# Patient Record
Sex: Male | Born: 1955
Health system: Southern US, Community
[De-identification: ages and names within clinical notes are randomized; demographics above are authoritative.]

## PROBLEM LIST (undated history)

## (undated) DIAGNOSIS — C73 Malignant neoplasm of thyroid gland: Secondary | ICD-10-CM

## (undated) DIAGNOSIS — Z87442 Personal history of urinary calculi: Secondary | ICD-10-CM

## (undated) HISTORY — DX: Malignant neoplasm of thyroid gland: C73

---

## 2002-06-19 HISTORY — PX: WRIST FRACTURE SURGERY: SHX121

## 2003-03-02 ENCOUNTER — Observation Stay (HOSPITAL_COMMUNITY): Admission: EM | Admit: 2003-03-02 | Discharge: 2003-03-02 | Payer: Self-pay | Admitting: Emergency Medicine

## 2003-03-02 ENCOUNTER — Encounter: Payer: Self-pay | Admitting: Orthopedic Surgery

## 2003-03-02 ENCOUNTER — Encounter: Payer: Self-pay | Admitting: Emergency Medicine

## 2003-04-22 ENCOUNTER — Ambulatory Visit (HOSPITAL_COMMUNITY): Admission: RE | Admit: 2003-04-22 | Discharge: 2003-04-22 | Payer: Self-pay | Admitting: Orthopedic Surgery

## 2005-05-31 ENCOUNTER — Ambulatory Visit: Payer: Self-pay | Admitting: Orthopedic Surgery

## 2012-06-19 ENCOUNTER — Emergency Department (HOSPITAL_COMMUNITY): Payer: BC Managed Care – PPO

## 2012-06-19 ENCOUNTER — Encounter (HOSPITAL_COMMUNITY): Payer: Self-pay | Admitting: *Deleted

## 2012-06-19 ENCOUNTER — Emergency Department (HOSPITAL_COMMUNITY)
Admission: EM | Admit: 2012-06-19 | Discharge: 2012-06-19 | Disposition: A | Discharge status: Home / Self Care | Payer: BC Managed Care – PPO | Attending: Emergency Medicine | Admitting: Emergency Medicine

## 2012-06-19 DIAGNOSIS — Y939 Activity, unspecified: Secondary | ICD-10-CM | POA: Insufficient documentation

## 2012-06-19 DIAGNOSIS — S62639B Displaced fracture of distal phalanx of unspecified finger, initial encounter for open fracture: Secondary | ICD-10-CM

## 2012-06-19 DIAGNOSIS — IMO0002 Reserved for concepts with insufficient information to code with codable children: Secondary | ICD-10-CM

## 2012-06-19 DIAGNOSIS — W230XXA Caught, crushed, jammed, or pinched between moving objects, initial encounter: Secondary | ICD-10-CM | POA: Insufficient documentation

## 2012-06-19 DIAGNOSIS — Z23 Encounter for immunization: Secondary | ICD-10-CM | POA: Insufficient documentation

## 2012-06-19 DIAGNOSIS — Y929 Unspecified place or not applicable: Secondary | ICD-10-CM | POA: Insufficient documentation

## 2012-06-19 MED ORDER — TETANUS-DIPHTH-ACELL PERTUSSIS 5-2.5-18.5 LF-MCG/0.5 IM SUSP
0.5000 mL | Freq: Once | INTRAMUSCULAR | Status: AC
  Administered 2012-06-19: 0.5 mL via INTRAMUSCULAR
  Filled 2012-06-19: qty 0.5

## 2012-06-19 NOTE — ED Notes (Addendum)
To ED for eval after right small finger injury. Pt states he was seen at hospital in [place] and told he broke his finger and needed to call to have it 'reset' tomorrow. Pt to ED due to continued bleeding and 'bone sticking out of finger'. States he was only given a script for abx and none in ED. When bandage removed appears to be chip of bone showing in open lac just under nail bed. Bleeding controlled

## 2012-06-19 NOTE — ED Notes (Signed)
Pt transported to xray 

## 2012-06-19 NOTE — ED Notes (Signed)
Pt to be discharged home. No further questions at the time.

## 2012-06-19 NOTE — ED Provider Notes (Signed)
History     CSN: [id number]  Arrival date & time 06/19/12  7829   First MD Initiated Contact with Patient 06/19/12 1007      Chief Complaint  Patient presents with  . Finger Injury    (Consider location/radiation/quality/duration/timing/severity/associated sxs/prior treatment) The history is provided by the patient and medical records.    Angel Chambers is a 57 y.o. male  with no known medical history presents to the Emergency Department complaining of acute, stabilized laceration and crush injury to the right distal little finger onset yesterday afternoon. Patient was seen in [facility] where the wound was washed and he was given prescriptions to go home with but nothing else was done. Patient was not given a T-DAP booster that time. Patient presents today concerned about the fact the wound is opened and he is not sure how he needs to followup. Associated symptoms include uncontrolled bleeding, pain.  Nothing makes it better and nothing makes it worse.  Pt denies fever, chills, headache, neck pain, no shortness of breath, abdominal pain, nausea, vomiting, diarrhea.     History reviewed. No pertinent past medical history.  History reviewed. No pertinent past surgical history.  History reviewed. No pertinent family history.  History  Substance Use Topics  . Smoking status: Never Smoker   . Smokeless tobacco: Not on file  . Alcohol Use: No      Review of Systems  Constitutional: Negative for fever.  Skin: Positive for wound.  Neurological: Negative for weakness and numbness.  All other systems reviewed and are negative.    Allergies  Review of patient's allergies indicates no known allergies.  Home Medications  No current outpatient prescriptions on file.  BP 127/92  Pulse 69  Temp 98.1 F (36.7 C) (Oral)  Resp 20  SpO2 98%  Physical Exam  Nursing note and vitals reviewed. Constitutional: He appears well-developed and well-nourished. No distress.  HENT:    Head: Normocephalic and atraumatic.  Eyes: Conjunctivae normal are normal. No scleral icterus.  Cardiovascular: Normal rate, regular rhythm, normal heart sounds and intact distal pulses.  Exam reveals no gallop and no friction rub.   No murmur heard.      Capillary refill less than 3 seconds  Pulmonary/Chest: Effort normal and breath sounds normal. No respiratory distress. He has no wheezes.  Musculoskeletal: Normal range of motion. He exhibits tenderness. He exhibits no edema.       ROM: Full range of motion of the right little finger, fingers on the right hand and right wrist  Neurological: He is alert. Coordination normal.       Sensation sensation intact to dull and sharp Strength 5 out of 5 in the right little finger, fingers of the right hand, right wrist  Skin: Skin is warm and dry. He is not diaphoretic.       Laceration to the distal portion of the right little finger with cuticle disruption into the base of the nail is lifted out of the bed    ED Course  Procedures (including critical care time)  Labs Reviewed - No data to display Dg Finger Little Right  06/19/2012  *RADIOLOGY REPORT*  Clinical Data: Bleeding and deformity secondary to trauma.  RIGHT LITTLE FINGER 2+V  Comparison: None.  Findings: There is a comminuted fracture of the distal aspect of the proximal phalangeal bone.  There is an overlying dorsal laceration.  No other abnormalities.  IMPRESSION: Comminuted fracture of the distal phalangeal bone of the right little finger.  Original Report Authenticated By: Francene Boyers, M.D.    LACERATION REPAIR Performed by: Dierdre Forth Authorized by: Dierdre Forth Consent: Verbal consent obtained. Risks and benefits: risks, benefits and alternatives were discussed Consent given by: patient Patient identity confirmed: provided demographic data Prepped and Draped in normal sterile fashion Wound explored  Laceration Location: Right hand, little finger  distal  Laceration Length: 2 cm  No Foreign Bodies seen or palpated  Anesthesia: Nerve block   Local anesthetic: lidocaine 2 % without epinephrine  Anesthetic total: 5 ml  Irrigation method: syringe Amount of cleaning: standard  Skin closure: 5-0 Prolene   Number of sutures: 2   Technique: Interrupted, just enough to approximate some of the tissue, wound does not close tightly   Patient tolerance: Patient tolerated the procedure well with no immediate complications.   1. Laceration   2. Fracture of distal phalanx of finger, open       MDM  Angel Chambers presents after being seen at [facility] with concerns about his right finger.  She would open wound.  X-ray with comminuted fracture of the distal phalangeal bone of the right little finger.  Discussed the patient with Dr. Melvyn Novas he suggested tacking the skin a laceration back together and then having the patient followup with him in the clinic tomorrow.  Discussed these findings with the patient as well as wound care.  Tdap booster given.Pressure irrigation performed. Pt has no co morbidities to effect normal wound healing. Patient given prescription for Keflex yesterday and Avastin to fill that prescription and begin taking it. Discussed suture home care w pt and answered questions. Pt to f-u for wound check with Dr. Melvyn Novas, hand surgery tomorrow. Pt is hemodynamically stable w no complaints prior to dc.     1. Medications: Vicodin and Keflex as prescribed yesterday, usual home medications 2. Treatment: rest, drink plenty of fluids, keep the wound clean and dry 3. Follow Up: Please followup with your primary doctor for discussion of your diagnoses and further evaluation after today's visit; if you do not have a primary care doctor use the resource guide provided to find one; followup with Dr. Melvyn Novas tomorrow morning for evaluation of your finger          Dierdre Forth, PA-C 06/19/12 1318

## 2012-06-19 NOTE — ED Notes (Signed)
Pt presents to department for evaluation of R 5th finger injury. States he was working with [employer] data [employer] when he lacerated finger. States he was seen at [facility] yesterday for same, was told to follow up with orthopedic physician to have finger reset. Upon arrival open 2cm jagged lacertion noted. Pt denies pain. CMS intact. States that when he moves finger he feels a "popping" sensation. States he was prescribed pain medication and antibiotics, but has not had them filled yet.

## 2012-06-19 NOTE — ED Provider Notes (Signed)
Medical screening examination/treatment/procedure(s) were performed by non-physician practitioner and as supervising physician I was immediately available for consultation/collaboration.   Flint Melter, MD 06/19/12 [phone]

## 2013-11-06 ENCOUNTER — Ambulatory Visit: Payer: Self-pay | Admitting: Physician Assistant

## 2013-11-11 ENCOUNTER — Encounter (INDEPENDENT_AMBULATORY_CARE_PROVIDER_SITE_OTHER): Payer: Self-pay

## 2013-11-11 ENCOUNTER — Ambulatory Visit: Payer: BC Managed Care – PPO | Admitting: Family Medicine

## 2013-11-11 VITALS — BP 132/77 | HR 98 | Temp 97.8°F | Ht 69.0 in | Wt 188.8 lb

## 2013-11-11 DIAGNOSIS — Z0289 Encounter for other administrative examinations: Secondary | ICD-10-CM

## 2013-11-11 DIAGNOSIS — Z139 Encounter for screening, unspecified: Secondary | ICD-10-CM

## 2013-11-11 LAB — POCT URINALYSIS DIPSTICK
Bilirubin, UA: NEGATIVE
Blood, UA: NEGATIVE
Glucose, UA: NEGATIVE
Ketones, UA: NEGATIVE
Leukocytes, UA: NEGATIVE
Nitrite, UA: NEGATIVE
Protein, UA: NEGATIVE
Spec Grav, UA: 1.025
Urobilinogen, UA: NEGATIVE
pH, UA: 6.5

## 2013-11-11 NOTE — Progress Notes (Signed)
   Subjective:    Patient ID: Angel Chambers, male    DOB: [birth year], 58 y.o.   MRN: [id number]  HPI  This 58 y.o. 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  He has no acute complaints. He is not on any medicine.  Review of Systems    No chest pain, SOB, HA, dizziness, vision change, N/V, diarrhea, constipation, dysuria, urinary urgency or frequency, myalgias, arthralgias or rash.  Objective:   Physical Exam  Vital signs noted  Well developed well nourished male.  HEENT - Head atraumatic Normocephalic                Eyes - PERRLA, Conjuctiva - clear Sclera- Clear EOMI                Ears - EAC's Wnl TM's Wnl Gross Hearing WNL                Nose - Nares patent                 Throat - oropharanx wnl Respiratory - Lungs CTA bilateral Cardiac - RRR S1 and S2 without murmur GI - Abdomen soft Nontender and bowel sounds active x 4 Extremities - No edema. Neuro - Grossly intact.  Results for orders placed in visit on 11/11/13  POCT URINALYSIS DIPSTICK      Result Value Ref Range   Color, UA yellow     Clarity, UA clear     Glucose, UA neg     Bilirubin, UA neg     Ketones, UA neg     Spec Grav, UA 1.025     Blood, UA neg     pH, UA 6.5     Protein, UA neg     Urobilinogen, UA negative     Nitrite, UA neg     Leukocytes, UA Negative        Assessment & Plan:  Screening - Plan: POCT urinalysis dipstick  Encounter for examination required by Department of Transportation (DOT)  Good for 2 years  Lysbeth Penner FNP

## 2015-10-13 ENCOUNTER — Encounter: Payer: Self-pay | Admitting: Family Medicine

## 2015-10-13 ENCOUNTER — Encounter (INDEPENDENT_AMBULATORY_CARE_PROVIDER_SITE_OTHER): Payer: Self-pay

## 2015-10-13 ENCOUNTER — Ambulatory Visit (INDEPENDENT_AMBULATORY_CARE_PROVIDER_SITE_OTHER): Payer: Self-pay | Admitting: Family Medicine

## 2015-10-13 VITALS — BP 135/88 | HR 72 | Temp 97.0°F | Ht 69.0 in | Wt 194.8 lb

## 2015-10-13 DIAGNOSIS — Z024 Encounter for examination for driving license: Secondary | ICD-10-CM

## 2015-10-13 DIAGNOSIS — Z0289 Encounter for other administrative examinations: Secondary | ICD-10-CM

## 2015-10-13 DIAGNOSIS — R221 Localized swelling, mass and lump, neck: Secondary | ICD-10-CM

## 2015-10-13 LAB — URINALYSIS
BILIRUBIN UA: NEGATIVE
GLUCOSE, UA: NEGATIVE
KETONES UA: NEGATIVE
Leukocytes, UA: NEGATIVE
Nitrite, UA: NEGATIVE
PROTEIN UA: NEGATIVE
RBC, UA: NEGATIVE
Specific Gravity, UA: 1.025 (ref 1.005–1.030)
UUROB: 0.2 mg/dL (ref 0.2–1.0)
pH, UA: 6 (ref 5.0–7.5)

## 2015-10-13 NOTE — Addendum Note (Signed)
Addended by: Denyce Robert on: 10/13/2015 10:05 AM   Modules accepted: Orders

## 2015-10-13 NOTE — Progress Notes (Signed)
Subjective:  Patient ID: Angel Chambers, male    DOB: [birth year]  Age: 60 y.o. MRN: [id number]  CC: DOT PE   HPI Angel Chambers presents for DOT exam   History Angel Chambers has no past medical history on file.   He has no past surgical history on file.   His family history is not on file.He reports that he has never smoked. He does not have any smokeless tobacco history on file. He reports that he does not drink alcohol or use illicit drugs.    ROS Review of Systems  Constitutional: Negative for fever, chills, diaphoresis and unexpected weight change.  HENT: Negative for congestion, hearing loss, rhinorrhea and sore throat.   Eyes: Negative for visual disturbance.  Respiratory: Negative for cough and shortness of breath.   Cardiovascular: Negative for chest pain.  Gastrointestinal: Negative for abdominal pain, diarrhea and constipation.  Genitourinary: Negative for dysuria and flank pain.  Musculoskeletal: Negative for joint swelling and arthralgias.  Skin: Negative for rash.  Neurological: Negative for dizziness and headaches.  Psychiatric/Behavioral: Negative for sleep disturbance and dysphoric mood.    Objective:  BP 135/88 mmHg  Pulse 72  Temp(Src) 97 F (36.1 C) (Oral)  Ht 5\' 9"  (1.753 m)  Wt 194 lb 12.8 oz (88.361 kg)  BMI 28.75 kg/m2  SpO2 97%  BP Readings from Last 3 Encounters:  10/13/15 135/88  11/11/13 132/77  06/19/12 127/92    Wt Readings from Last 3 Encounters:  10/13/15 194 lb 12.8 oz (88.361 kg)  11/11/13 188 lb 12.8 oz (85.639 kg)     Physical Exam  Constitutional: He is oriented to person, place, and time. He appears well-developed and well-nourished. No distress.  HENT:  Head: Normocephalic and atraumatic.  Right Ear: External ear normal.  Left Ear: External ear normal.  Nose: Nose normal.  Mouth/Throat: Oropharynx is clear and moist.  Eyes: Conjunctivae and EOM are normal. Pupils are equal, round, and reactive to light.  Neck: Normal range of  motion. Neck supple. Thyromegaly (1 cm nodule to left of thyroid. Some movement with swallowing. Not clearly attached per palpation) present.  Cardiovascular: Normal rate, regular rhythm and normal heart sounds.   No murmur heard. Pulmonary/Chest: Effort normal and breath sounds normal. No respiratory distress. He has no wheezes. He has no rales.  Abdominal: Soft. Bowel sounds are normal. He exhibits no distension. There is no tenderness.  Lymphadenopathy:    He has no cervical adenopathy.  Neurological: He is alert and oriented to person, place, and time. He has normal reflexes.  Skin: Skin is warm and dry.  Psychiatric: He has a normal mood and affect. His behavior is normal. Judgment and thought content normal.     No results found for: WBC, HGB, HCT, PLT, GLUCOSE, CHOL, TRIG, HDL, LDLDIRECT, LDLCALC, ALT, AST, NA, K, CL, CREATININE, BUN, CO2, TSH, PSA, INR, GLUF, HGBA1C, MICROALBUR  Dg Finger Little Right  06/19/2012  *RADIOLOGY REPORT* Clinical Data: Bleeding and deformity secondary to trauma. RIGHT LITTLE FINGER 2+V Comparison: None. Findings: There is a comminuted fracture of the distal aspect of the proximal phalangeal bone.  There is an overlying dorsal laceration.  No other abnormalities. IMPRESSION: Comminuted fracture of the distal phalangeal bone of the right little finger. Original Report Authenticated By: Lorriane Shire, M.D.    Assessment & Plan:   Angel Chambers was seen today for dot pe.  Diagnoses and all orders for this visit:  Encounter for examination required by Department of Transportation (DOT)  Nodule of neck      Angel Chambers does not currently have medications on file.  No orders of the defined types were placed in this encounter.     Follow-up: Return in about 2 years (around 10/12/2017), or and for monitoring of nodule.  Claretta Fraise, M.D.

## 2016-01-11 DIAGNOSIS — S61213A Laceration without foreign body of left middle finger without damage to nail, initial encounter: Secondary | ICD-10-CM | POA: Diagnosis not present

## 2016-01-11 DIAGNOSIS — Z23 Encounter for immunization: Secondary | ICD-10-CM | POA: Diagnosis not present

## 2016-01-11 DIAGNOSIS — W293XXA Contact with powered garden and outdoor hand tools and machinery, initial encounter: Secondary | ICD-10-CM | POA: Diagnosis not present

## 2016-01-11 DIAGNOSIS — S6992XA Unspecified injury of left wrist, hand and finger(s), initial encounter: Secondary | ICD-10-CM | POA: Diagnosis not present

## 2016-01-11 DIAGNOSIS — S61211A Laceration without foreign body of left index finger without damage to nail, initial encounter: Secondary | ICD-10-CM | POA: Diagnosis not present

## 2017-10-19 ENCOUNTER — Ambulatory Visit: Payer: Self-pay | Admitting: Family Medicine

## 2017-10-19 ENCOUNTER — Encounter: Payer: Self-pay | Admitting: Family Medicine

## 2017-10-19 VITALS — BP 130/81 | HR 72 | Temp 97.1°F | Ht 69.0 in | Wt 191.5 lb

## 2017-10-19 DIAGNOSIS — Z024 Encounter for examination for driving license: Secondary | ICD-10-CM

## 2017-10-19 DIAGNOSIS — E041 Nontoxic single thyroid nodule: Secondary | ICD-10-CM

## 2017-10-19 NOTE — Progress Notes (Signed)
   Subjective:  Patient ID: Angel Chambers, male    DOB: 26-Mar-1956  Age: 62 y.o. MRN: [id number]  CC: Private DOT Physical   HPI Angel Chambers presents for DOT exam  Depression screen [facility] 2/9 10/13/2015 11/11/2013  Decreased Interest 0 0  Down, Depressed, Hopeless 0 0  PHQ - 2 Score 0 0    History Angel Chambers has no past medical history on file.   Angel Chambers has no past surgical history on file.   His family history is not on file.Angel Chambers reports that Angel Chambers has never smoked. Angel Chambers has never used smokeless tobacco. Angel Chambers reports that Angel Chambers does not drink alcohol or use drugs.    ROS Review of Systems  Constitutional: Negative.   HENT: Negative.   Eyes: Negative for visual disturbance.  Respiratory: Negative for cough and shortness of breath.   Cardiovascular: Negative for chest pain and leg swelling.  Gastrointestinal: Negative for abdominal pain, diarrhea, nausea and vomiting.  Genitourinary: Negative for difficulty urinating.  Musculoskeletal: Negative for arthralgias and myalgias.  Skin: Negative for rash.  Neurological: Negative for headaches.  Psychiatric/Behavioral: Negative for sleep disturbance.    Objective:  BP 130/81   Pulse 72   Temp (!) 97.1 F (36.2 C) (Oral)   Ht 5\' 9"  (1.753 m)   Wt 191 lb 8 oz (86.9 kg)   BMI 28.28 kg/m   BP Readings from Last 3 Encounters:  10/19/17 130/81  10/13/15 135/88  11/11/13 132/77    Wt Readings from Last 3 Encounters:  10/19/17 191 lb 8 oz (86.9 kg)  10/13/15 194 lb 12.8 oz (88.4 kg)  11/11/13 188 lb 12.8 oz (85.6 kg)     Physical Exam  Constitutional: Angel Chambers is oriented to person, place, and time. Angel Chambers appears well-developed and well-nourished. No distress.  HENT:  Head: Normocephalic and atraumatic.  Right Ear: External ear normal.  Left Ear: External ear normal.  Nose: Nose normal.  Mouth/Throat: Oropharynx is clear and moist.  Eyes: Pupils are equal, round, and reactive to light. Conjunctivae and EOM are normal.  Neck: Normal range of  motion. Neck supple. Thyromegaly (There is approximately 1 cm round smooth nodule noted left side of the thyroid on palpation) present.  Cardiovascular: Normal rate, regular rhythm and normal heart sounds.  No murmur heard. Pulmonary/Chest: Effort normal and breath sounds normal. No respiratory distress. Angel Chambers has no wheezes. Angel Chambers has no rales.  Abdominal: Soft. Bowel sounds are normal. Angel Chambers exhibits no distension. There is no tenderness.  Lymphadenopathy:    Angel Chambers has no cervical adenopathy.  Neurological: Angel Chambers is alert and oriented to person, place, and time. Angel Chambers has normal reflexes.  Skin: Skin is warm and dry.  Psychiatric: Angel Chambers has a normal mood and affect. His behavior is normal. Judgment and thought content normal.      Assessment & Plan:   Angel Chambers was seen today for private dot physical.  Diagnoses and all orders for this visit:  Encounter for Department of Transportation (DOT) examination for driving license renewal  Nodule of left lobe of thyroid gland -     US THYROID; Future       Angel Chambers does not currently have medications on file.  Allergies as of 10/19/2017   No Known Allergies     Medication List    as of 10/19/2017 11:05 AM   You have not been prescribed any medications.      Follow-up: Return in about 2 weeks (around 11/02/2017).  Claretta Fraise, M.D.

## 2017-10-29 ENCOUNTER — Ambulatory Visit (HOSPITAL_COMMUNITY)
Admission: RE | Admit: 2017-10-29 | Discharge: 2017-10-29 | Disposition: A | Discharge status: Home / Self Care | Payer: BLUE CROSS/BLUE SHIELD | Source: Ambulatory Visit | Attending: Family Medicine | Admitting: Family Medicine

## 2017-10-29 DIAGNOSIS — E041 Nontoxic single thyroid nodule: Secondary | ICD-10-CM | POA: Diagnosis not present

## 2017-10-30 ENCOUNTER — Encounter: Payer: Self-pay | Admitting: Family Medicine

## 2017-10-30 ENCOUNTER — Ambulatory Visit: Payer: BLUE CROSS/BLUE SHIELD | Admitting: Family Medicine

## 2017-10-30 VITALS — BP 127/83 | HR 65 | Temp 97.8°F | Ht 69.0 in | Wt 193.1 lb

## 2017-10-30 DIAGNOSIS — Z114 Encounter for screening for human immunodeficiency virus [HIV]: Secondary | ICD-10-CM

## 2017-10-30 DIAGNOSIS — E041 Nontoxic single thyroid nodule: Secondary | ICD-10-CM

## 2017-10-30 DIAGNOSIS — Z1159 Encounter for screening for other viral diseases: Secondary | ICD-10-CM | POA: Diagnosis not present

## 2017-10-30 NOTE — Progress Notes (Signed)
 Subjective:  Patient ID: Angel Chambers, male    DOB: 01/10/1956  Age: 61 y.o. MRN: [id number]  CC: Results (pt here today to go over thyroid ultrasound)   HPI Angel Chambers presents for patient was recently in for a DOT physical and noted to have a moderate nodule on his thyroid during that evaluation.  He was sent for an ultrasound and is here today for consultation regarding the results.  Of note is that he has gained 10 pounds in the last year.  His energy is decreased by one half compared to what it was 10 years ago.  Otherwise he denies any thyroid symptoms including feeling hot or cold constipation hair loss or myxedema.  Depression screen PHQ 2/9 10/30/2017 10/13/2015 11/11/2013  Decreased Interest 0 0 0  Down, Depressed, Hopeless 0 0 0  PHQ - 2 Score 0 0 0    History Angel Chambers has no past medical history on file.   He has no past surgical history on file.   His family history is not on file.He reports that he has never smoked. He has never used smokeless tobacco. He reports that he does not drink alcohol or use drugs.    ROS Review of Systems Noncontributory except as per HPI Objective:  BP 127/83   Pulse 65   Temp 97.8 F (36.6 C) (Oral)   Ht 5' 9" (1.753 m)   Wt 193 lb 2 oz (87.6 kg)   BMI 28.52 kg/m   BP Readings from Last 3 Encounters:  10/30/17 127/83  10/19/17 130/81  10/13/15 135/88    Wt Readings from Last 3 Encounters:  10/30/17 193 lb 2 oz (87.6 kg)  10/19/17 191 lb 8 oz (86.9 kg)  10/13/15 194 lb 12.8 oz (88.4 kg)     Physical Exam  Constitutional: He is oriented to person, place, and time. He appears well-developed and well-nourished. No distress.  Patient stutters  HENT:  Head: Normocephalic and atraumatic.  Eyes: Pupils are equal, round, and reactive to light. Conjunctivae and EOM are normal.  Neck: Normal range of motion. Thyromegaly present.  Musculoskeletal: Normal range of motion.  Neurological: He is alert and oriented to person, place, and  time. No cranial nerve deficit.  Skin: Skin is warm and dry.  Psychiatric: He has a normal mood and affect.   2 thyroid nodules were noted on the patient's ultrasound.  Each of these was reviewed with the patient today.   Assessment & Plan:   Angel Chambers was seen today for results.  Diagnoses and all orders for this visit:  Nodule of left lobe of thyroid gland -     CBC with Differential/Platelet -     CMP14+EGFR -     TSH -     US Thyroid Biopsy; Future  Screening for HIV (human immunodeficiency virus) -     HIV antibody  Encounter for hepatitis C screening test for low risk patient -     Hepatitis C antibody   Discussion of the implications of nodules was performed.  The larger of the 2 nodules carries a recommendation for fine-needle aspiration.  The radiologist had previously explained this to the patient he is comfortable with the idea and will go forward with that.  The smaller one carries the recommendation of repeat annually.  Patient is in agreement with that as well.  This will be performed as well in 1 year.    Angel Chambers does not currently have medications on file.    Allergies as of 10/30/2017   No Known Allergies     Medication List    as of 10/30/2017  6:26 PM   You have not been prescribed any medications.      Follow-up: No follow-ups on file.  Warren Stacks, M.D. 

## 2017-10-31 ENCOUNTER — Other Ambulatory Visit: Payer: Self-pay | Admitting: Family Medicine

## 2017-10-31 LAB — CBC WITH DIFFERENTIAL/PLATELET
BASOS: 1 %
Basophils Absolute: 0 10*3/uL (ref 0.0–0.2)
EOS (ABSOLUTE): 0.1 10*3/uL (ref 0.0–0.4)
Eos: 2 %
HEMOGLOBIN: 15.1 g/dL (ref 13.0–17.7)
Hematocrit: 43.8 % (ref 37.5–51.0)
IMMATURE GRANS (ABS): 0 10*3/uL (ref 0.0–0.1)
Immature Granulocytes: 0 %
LYMPHS ABS: 1.3 10*3/uL (ref 0.7–3.1)
LYMPHS: 27 %
MCH: 31.7 pg (ref 26.6–33.0)
MCHC: 34.5 g/dL (ref 31.5–35.7)
MCV: 92 fL (ref 79–97)
Monocytes Absolute: 0.4 10*3/uL (ref 0.1–0.9)
Monocytes: 8 %
NEUTROS ABS: 3.1 10*3/uL (ref 1.4–7.0)
Neutrophils: 62 %
Platelets: 175 10*3/uL (ref 150–379)
RBC: 4.76 x10E6/uL (ref 4.14–5.80)
RDW: 13.6 % (ref 12.3–15.4)
WBC: 5 10*3/uL (ref 3.4–10.8)

## 2017-10-31 LAB — CMP14+EGFR
ALBUMIN: 4.3 g/dL (ref 3.6–4.8)
ALT: 15 IU/L (ref 0–44)
AST: 12 IU/L (ref 0–40)
Albumin/Globulin Ratio: 1.8 (ref 1.2–2.2)
Alkaline Phosphatase: 81 IU/L (ref 39–117)
BILIRUBIN TOTAL: 0.4 mg/dL (ref 0.0–1.2)
BUN / CREAT RATIO: 14 (ref 10–24)
BUN: 13 mg/dL (ref 8–27)
CHLORIDE: 104 mmol/L (ref 96–106)
CO2: 22 mmol/L (ref 20–29)
CREATININE: 0.91 mg/dL (ref 0.76–1.27)
Calcium: 9.6 mg/dL (ref 8.6–10.2)
GFR calc non Af Amer: 91 mL/min/{1.73_m2} (ref >59)
GFR, EST AFRICAN AMERICAN: 106 mL/min/{1.73_m2} (ref >59)
GLUCOSE: 106 mg/dL — AB (ref 65–99)
Globulin, Total: 2.4 g/dL (ref 1.5–4.5)
POTASSIUM: 4.4 mmol/L (ref 3.5–5.2)
SODIUM: 142 mmol/L (ref 134–144)
TOTAL PROTEIN: 6.7 g/dL (ref 6.0–8.5)

## 2017-10-31 LAB — HIV ANTIBODY (ROUTINE TESTING W REFLEX): HIV Screen 4th Generation wRfx: NONREACTIVE

## 2017-10-31 LAB — HEPATITIS C ANTIBODY

## 2017-10-31 LAB — TSH: TSH: 1.46 u[IU]/mL (ref 0.450–4.500)

## 2017-11-05 ENCOUNTER — Telehealth: Payer: Self-pay | Admitting: *Deleted

## 2017-11-05 NOTE — Telephone Encounter (Signed)
Patient has an appt on 11/08/17 for [place] at [facility]

## 2017-11-08 ENCOUNTER — Ambulatory Visit (HOSPITAL_COMMUNITY)
Admission: RE | Admit: 2017-11-08 | Discharge: 2017-11-08 | Disposition: A | Discharge status: Home / Self Care | Payer: BLUE CROSS/BLUE SHIELD | Source: Ambulatory Visit | Attending: Family Medicine | Admitting: Family Medicine

## 2017-11-08 ENCOUNTER — Encounter (HOSPITAL_COMMUNITY): Payer: Self-pay

## 2017-11-08 DIAGNOSIS — E041 Nontoxic single thyroid nodule: Secondary | ICD-10-CM | POA: Diagnosis not present

## 2017-11-08 DIAGNOSIS — D497 Neoplasm of unspecified behavior of endocrine glands and other parts of nervous system: Secondary | ICD-10-CM | POA: Diagnosis not present

## 2017-11-08 DIAGNOSIS — C73 Malignant neoplasm of thyroid gland: Secondary | ICD-10-CM | POA: Diagnosis not present

## 2017-11-08 MED ORDER — LIDOCAINE HCL (PF) 2 % IJ SOLN
INTRAMUSCULAR | Status: AC
  Administered 2017-11-08: 10 mL
  Filled 2017-11-08: qty 10

## 2017-11-08 NOTE — Sedation Documentation (Signed)
Thyroid biopsy completed, discharge instruction given, verbalized understanding. Denies pain. Sample labeled and taken to the lab

## 2017-11-08 NOTE — Discharge Instructions (Signed)
Thyroid Biopsy °The thyroid gland is a butterfly-shaped gland located in the front of the neck. It produces hormones that affect metabolism, growth and development, and body temperature. Thyroid biopsy is a procedure in which small samples of tissue or fluid are removed from the thyroid gland. The samples are then looked at under a microscope to check for abnormalities. This procedure is done to determine the cause of thyroid problems. It may be done to check for infection, cancer, or other thyroid problems. °Two methods may be used for a thyroid biopsy. In one method, a thin needle is inserted through the skin and into the thyroid gland. In the other method, an open incision is made through the skin. °Tell a health care provider about: °· Any allergies you have. °· All medicines you are taking, including vitamins, herbs, eye drops, creams, and over-the-counter medicines. °· Any problems you or family members have had with anesthetic medicines. °· Any blood disorders you have. °· Any surgeries you have had. °· Any medical conditions you have. °What are the risks? °Generally, this is a safe procedure. However, problems can occur and include: °· Bleeding from the procedure site. °· Infection. °· Injury to structures near the thyroid gland. ° °What happens before the procedure? °· Ask your health care provider about: °? Changing or stopping your regular medicines. This is especially important if you are taking diabetes medicines or blood thinners. °? Taking medicines such as aspirin and ibuprofen. These medicines can thin your blood. Do not take these medicines before your procedure if your health care provider asks you not to. °· Do not eat or drink anything after midnight on the night before the procedure or as directed by your health care provider. °· You may have a blood sample taken. °What happens during the procedure? °Either of these methods may be used to perform a thyroid biopsy: °· Fine needle biopsy. You may  be given medicine to help you relax (sedative). You will be asked to lie on your back with your head tipped backward to extend your neck. An area on your neck will be cleaned. A needle will then be inserted through the skin of your neck. You may be asked to avoid coughing, talking, swallowing, or making sounds during some portions of the procedure. The needle will be withdrawn once the tissue or fluid samples have been removed. Pressure may be applied to your neck to reduce swelling and ensure that bleeding has stopped. The samples will be sent to a lab for examination. °· Open biopsy. You will be given medicine to make you sleep (general anesthetic). An incision will be made in your neck. A sample of thyroid tissue will be removed using surgical tools. The tissue sample will be sent for examination. In some cases, the sample may be examined during the biopsy. If that is done and cancer cells are found, some or all of the thyroid gland may be removed. The incision will be closed with stitches. ° °What happens after the procedure? °· Your recovery will be assessed and monitored. °· You may have soreness and tenderness at the site of the biopsy. This should go away after a few days. °· If you had an open biopsy, you may have a hoarse voice or sore throat for a couple days. °· It is your responsibility to get your test results. °This information is not intended to replace advice given to you by your health care provider. Make sure you discuss any questions you have with   your health care provider. °Document Released: 04/02/2007 Document Revised: 02/06/2016 Document Reviewed: 08/28/2013 °Elsevier Interactive Patient Education © 2018 [employer] ° °

## 2017-11-08 NOTE — Procedures (Signed)
PreOperative Dx: LT thyroid nodule Postoperative Dx: LT thyroid nodule Procedure:   US guided FNA of LT thyroid nodule Radiologist:  Thornton Papas Anesthesia:  2 ml of 2% lidocaine Specimen:  FNA x 5 plus aspiration of cystic fluid  EBL:   < 1 ml Complications: None

## 2017-11-10 DIAGNOSIS — E041 Nontoxic single thyroid nodule: Secondary | ICD-10-CM | POA: Diagnosis not present

## 2017-11-14 ENCOUNTER — Other Ambulatory Visit: Payer: Self-pay | Admitting: Family Medicine

## 2017-11-14 MED ORDER — FOLIC ACID-VIT B6-VIT B12 2.5-25-1 MG PO TABS: 1.0000 | ORAL_TABLET | Freq: Every day | ORAL | 3 refills | Status: DC

## 2017-11-14 MED ORDER — VITAMIN D (ERGOCALCIFEROL) 1.25 MG (50000 UNIT) PO CAPS: 50000.0000 [IU] | ORAL_CAPSULE | ORAL | 3 refills | Status: DC

## 2017-11-28 ENCOUNTER — Encounter (HOSPITAL_COMMUNITY): Payer: Self-pay

## 2017-12-06 ENCOUNTER — Telehealth: Payer: Self-pay | Admitting: Family Medicine

## 2017-12-07 ENCOUNTER — Ambulatory Visit (INDEPENDENT_AMBULATORY_CARE_PROVIDER_SITE_OTHER): Payer: BLUE CROSS/BLUE SHIELD | Admitting: Pediatrics

## 2017-12-07 DIAGNOSIS — C73 Malignant neoplasm of thyroid gland: Secondary | ICD-10-CM

## 2017-12-07 NOTE — Telephone Encounter (Signed)
Return patient call.  He has not heard anything about the biopsy results.  Reviewing the biopsy results, consistent with papillary carcinoma.  He has an appointment to see me this afternoon so we can review results and refer to appropriate next steps.

## 2017-12-07 NOTE — Progress Notes (Signed)
Discussed results of biopsy.  Set patient up with appoint with oncology.

## 2017-12-11 ENCOUNTER — Encounter (HOSPITAL_COMMUNITY): Payer: Self-pay | Admitting: Hematology

## 2017-12-11 ENCOUNTER — Inpatient Hospital Stay (HOSPITAL_COMMUNITY): Payer: BLUE CROSS/BLUE SHIELD | Attending: Hematology | Admitting: Hematology

## 2017-12-11 ENCOUNTER — Other Ambulatory Visit: Payer: Self-pay

## 2017-12-11 DIAGNOSIS — Z8049 Family history of malignant neoplasm of other genital organs: Secondary | ICD-10-CM | POA: Diagnosis not present

## 2017-12-11 DIAGNOSIS — C73 Malignant neoplasm of thyroid gland: Secondary | ICD-10-CM | POA: Diagnosis not present

## 2017-12-11 NOTE — Assessment & Plan Note (Signed)
1.  Papillary thyroid carcinoma: - Was found to have thyroid nodule on physical examination, ultrasound of the thyroid on 10/29/2017 showed left superior nodule 1.5 cm, solid and a second nodule in the left mid thyroid, measuring 4.1 cm, mixed cystic and solid with no evidence of adenopathy. - Fine-needle aspiration of thyroid nodule in the mid left lobe on 11/08/2017 consistent with papillary carcinoma, BRAF V600 E mutation positive. - He does not have any symptoms including dysphagia, hoarseness.  The mass is not bulky and does not clinically appear to extend below the substernum.  Hence I would not recommend any further scans at this time.  I will make a referral to surgery for total thyroidectomy/lobectomy.  I will see him back in a couple of months to discuss the results.

## 2017-12-11 NOTE — Progress Notes (Signed)
[facility] NOTE  Patient Care Team: Claretta Fraise, MD as PCP - General (Family Medicine)  CHIEF COMPLAINTS/PURPOSE OF CONSULTATION:  Papillary thyroid carcinoma.  HISTORY OF PRESENTING ILLNESS:  Angel Chambers 62 y.o. male is seen in consultation today for further management of papillary thyroid carcinoma.  He went for a physical earlier this year and was noted to have a thyroid nodule.  An ultrasound of the neck was ordered on 10/29/2017 which showed left superior thyroid nodule measuring 1.5 cm, solid and a second nodule in the left mid thyroid measuring 4.1 cm, mixed cystic and solid with no evidence of adenopathy.  On 11/08/2017 he underwent fine-needle aspiration of the left mid thyroid nodule.  This was consistent with papillary carcinoma of thyroid.  He does not report any fevers, night sweats or weight loss.  He denies any dysphagia or odynophagia or recent onset hoarseness.  No other local symptoms or generalized symptoms noted.  No new onset bone pains.  He works as a Copy.  No family history of thyroid malignancies although his brother had hyperthyroidism for which he underwent radioiodine ablation.  He was a never smoker.  He is accompanied by his wife today.  MEDICAL HISTORY:  Past Medical History:  Diagnosis Date  . Thyroid cancer [facility])     SURGICAL HISTORY: Past Surgical History:  Procedure Laterality Date  . WRIST FRACTURE SURGERY  2004    SOCIAL HISTORY: Social History   Socioeconomic History  . Marital status: Married    Spouse name: Not on file  . Number of children: Not on file  . Years of education: Not on file  . Highest education level: Not on file  Occupational History  . Not on file  Social Needs  . Financial resource strain: Not on file  . Food insecurity:    Worry: Not on file    Inability: Not on file  . Transportation needs:    Medical: Not on file    Non-medical: Not on file  Tobacco Use  . Smoking  status: Never Smoker  . Smokeless tobacco: Never Used  Substance and Sexual Activity  . Alcohol use: No  . Drug use: No  . Sexual activity: Yes  Lifestyle  . Physical activity:    Days per week: Not on file    Minutes per session: Not on file  . Stress: Not on file  Relationships  . Social connections:    Talks on phone: Not on file    Gets together: Not on file    Attends religious service: Not on file    Active member of club or organization: Not on file    Attends meetings of clubs or organizations: Not on file    Relationship status: Not on file  . Intimate partner violence:    Fear of current or ex partner: Not on file    Emotionally abused: Not on file    Physically abused: Not on file    Forced sexual activity: Not on file  Other Topics Concern  . Not on file  Social History Narrative  . Not on file    FAMILY HISTORY: Family History  Problem Relation Age of Onset  . Diabetes Mother   . Kidney disease Mother   . Heart attack Father   . Cancer Sister        hysterectomy due to cancer  . Hypertension Brother   . Heart disease Brother   . Thyroid disease Brother  ALLERGIES:  has No Known Allergies.  MEDICATIONS:  No current outpatient medications on file.   No current facility-administered medications for this visit.     REVIEW OF SYSTEMS:   Constitutional: Denies fevers, chills or abnormal night sweats Eyes: Denies blurriness of vision, double vision or watery eyes Ears, nose, mouth, throat, and face: Denies mucositis or sore throat Respiratory: Denies cough, dyspnea or wheezes Cardiovascular: Denies palpitation, chest discomfort or lower extremity swelling Gastrointestinal:  Denies nausea, heartburn or change in bowel habits Skin: Denies abnormal skin rashes Lymphatics: Denies new lymphadenopathy or easy bruising Neurological:Denies numbness, tingling or new weaknesses Behavioral/Psych: Mood is stable, no new changes  All other systems were  reviewed with the patient and are negative.  PHYSICAL EXAMINATION: ECOG PERFORMANCE STATUS: 0 - Asymptomatic  Vitals:   12/11/17 1340  BP: 127/73  Pulse: 72  Resp: 16  SpO2: 99%   Filed Weights   12/11/17 1340  Weight: 193 lb 6.4 oz (87.7 kg)    GENERAL:alert, no distress and comfortable SKIN: skin color, texture, turgor are normal, no rashes or significant lesions EYES: normal, conjunctiva are pink and non-injected, sclera clear OROPHARYNX:no exudate, no erythema and lips, buccal mucosa, and tongue normal  NECK: supple, non-tender, without nodularity.  Left thyroid nodule felt with no adenopathy. LYMPH:  no palpable lymphadenopathy in the cervical, axillary or inguinal LUNGS: clear to auscultation and percussion with normal breathing effort HEART: regular rate & rhythm and no murmurs and no lower extremity edema ABDOMEN:abdomen soft, non-tender and normal bowel sounds PSYCH: alert & oriented x 3 with fluent speech   LABORATORY DATA:  I have reviewed the data as listed Lab Results  Component Value Date   WBC 5.0 10/30/2017   HGB 15.1 10/30/2017   HCT 43.8 10/30/2017   MCV 92 10/30/2017   PLT 175 10/30/2017     Chemistry      Component Value Date/Time   NA 142 10/30/2017 0945   K 4.4 10/30/2017 0945   CL 104 10/30/2017 0945   CO2 22 10/30/2017 0945   BUN 13 10/30/2017 0945   CREATININE 0.91 10/30/2017 0945      Component Value Date/Time   CALCIUM 9.6 10/30/2017 0945   ALKPHOS 81 10/30/2017 0945   AST 12 10/30/2017 0945   ALT 15 10/30/2017 0945   BILITOT 0.4 10/30/2017 0945       RADIOGRAPHIC STUDIES: I have personally reviewed the ultrasound dated 10/29/2017 and agree with the report.  ASSESSMENT & PLAN:  Thyroid cancer ([place]) 1.  Papillary thyroid carcinoma: - Was found to have thyroid nodule on physical examination, ultrasound of the thyroid on 10/29/2017 showed left superior nodule 1.5 cm, solid and a second nodule in the left mid thyroid, measuring 4.1  cm, mixed cystic and solid with no evidence of adenopathy. - Fine-needle aspiration of thyroid nodule in the mid left lobe on 11/08/2017 consistent with papillary carcinoma, BRAF V600 E mutation positive. - He does not have any symptoms including dysphagia, hoarseness.  The mass is not bulky and does not clinically appear to extend below the substernum.  Hence I would not recommend any further scans at this time.  I will make a referral to surgery for total thyroidectomy/lobectomy.  I will see him back in a couple of months to discuss the results.  No orders of the defined types were placed in this encounter.   All questions were answered. The patient knows to call the clinic with any problems, questions or concerns.  Total  time spent is 45 minutes with more than 50% of the time spent face-to-face discussing his diagnosis, prognosis, primary treatment and coordination of care.    Derek Jack, MD 12/11/2017 1:58 PM

## 2017-12-18 ENCOUNTER — Encounter (HOSPITAL_COMMUNITY): Payer: Self-pay | Admitting: General Practice

## 2017-12-18 NOTE — Progress Notes (Signed)
[facility] CSW Progress Notes  Call to new patient to introduce self and support services available at [facility].  Patient reports he is doing well, working, no concerns at this time.  Aware of upcoming appointments and plan to manage cancer diagnosis.  Reviewed availability of support services as well as CSW contact information.  Encouraged patient to contact CSW as needed.  Edwyna Shell, LCSW Clinical Social Worker Phone:  [phone]

## 2017-12-27 ENCOUNTER — Ambulatory Visit: Payer: BLUE CROSS/BLUE SHIELD | Admitting: General Surgery

## 2017-12-27 ENCOUNTER — Encounter: Payer: Self-pay | Admitting: General Surgery

## 2017-12-27 VITALS — BP 146/88 | HR 66 | Temp 97.8°F | Resp 16 | Wt 192.0 lb

## 2017-12-27 DIAGNOSIS — C73 Malignant neoplasm of thyroid gland: Secondary | ICD-10-CM

## 2017-12-27 NOTE — Progress Notes (Signed)
Angel Chambers; [id number]; Nov 25, 1955   HPI Patient is a 62 year old white male who was referred to my care by Dr. Delton Coombes for evaluation and treatment of thyroid cancer.  He was originally found to have an enlarged left lobe of the thyroid gland on physical examination.  Ultrasound revealed 2 nodules in the left lobe.  Fine-needle aspiration of both revealed papillary carcinoma.  Patient denies any history of neck irradiation, family history of thyroid cancer, heat intolerance, voice changes, or weight loss.  He currently has no neck pain. Past Medical History:  Diagnosis Date  . Thyroid cancer [facility])     Past Surgical History:  Procedure Laterality Date  . WRIST FRACTURE SURGERY  2004    Family History  Problem Relation Age of Onset  . Diabetes Mother   . Kidney disease Mother   . Heart attack Father   . Cancer Sister        hysterectomy due to cancer  . Hypertension Brother   . Heart disease Brother   . Thyroid disease Brother     No current outpatient medications on file prior to visit.   No current facility-administered medications on file prior to visit.     No Known Allergies  Social History   Substance and Sexual Activity  Alcohol Use No    Social History   Tobacco Use  Smoking Status Never Smoker  Smokeless Tobacco Never Used    Review of Systems  Constitutional: Negative.   HENT: Negative.   Eyes: Negative.   Respiratory: Negative.   Cardiovascular: Negative.   Gastrointestinal: Negative.   Genitourinary: Negative.   Musculoskeletal: Negative.   Skin: Negative.   Neurological: Negative.   Endo/Heme/Allergies: Negative.   Psychiatric/Behavioral: Negative.     Objective   Vitals:   12/27/17 1110  BP: (!) 146/88  Pulse: 66  Resp: 16  Temp: 97.8 F (36.6 C)    Physical Exam  Constitutional: He is oriented to person, place, and time. He appears well-developed and well-nourished. No distress.  HENT:  Head: Normocephalic and atraumatic.   Neck: Normal range of motion. Neck supple. No tracheal deviation present. Thyromegaly present.  Enlarged nodular left lobe of thyroid gland.  Right lobe smooth.  Cardiovascular: Normal rate, regular rhythm and normal heart sounds. Exam reveals no gallop and no friction rub.  No murmur heard. Pulmonary/Chest: Effort normal and breath sounds normal. No stridor. No respiratory distress. He has no wheezes. He has no rales.  Lymphadenopathy:    He has no cervical adenopathy.  Neurological: He is alert and oriented to person, place, and time.  Skin: Skin is warm and dry.  Vitals reviewed.  Dr. Tomie China notes reviewed.  Ultrasound and pathology reports reviewed. Assessment  Papillary carcinoma of thyroid gland Plan   Patient is scheduled for a total thyroidectomy on 01/14/2018.  The risks and benefits of the procedure including bleeding, infection, voice changes, nerve injury, and hypocalcemia were fully explained to the patient, who gave informed consent.  He does realize that he will be on thyroid hormone supplementation for the rest of his life.

## 2017-12-27 NOTE — Patient Instructions (Signed)
Thyroidectomy A thyroidectomy is a surgery that is done to remove all (total thyroidectomy) or part (subtotal thyroidectomy) of your thyroid gland. The thyroid is a butterfly-shaped gland that is located at the lower front of your neck. It produces a substance that helps to control certain body processes (thyroid hormone). The amount of thyroid gland tissue that is removed during your thyroidectomy depends on the reason you need the procedure. You may have a thyroidectomy to treat conditions including:  Thyroid nodules.  Thyroid cancer.  Benign thyroid tumors.  Goiter.  Overactive thyroid gland (hyperthyroidism).  There are different ways to do a thyroidectomy:  Conventional thyroidectomy (open thyroidectomy). This procedure is the most common. In this procedure, the thyroid gland is removed through one surgical cut (incision) in the neck.  Endoscopic thyroidectomy. This procedure is less invasive. In this procedure, there may be several smaller incisions in the neck, chest, or armpit. The surgeon uses a tiny camera and other assistive tools to remove the thyroid gland.  Tell a health care provider about:  Any allergies you have.  All medicines you are taking, including vitamins, herbs, eye drops, creams, and over-the-counter medicines.  Any problems you or family members have had with anesthetic medicines.  Any blood disorders you have.  Any surgeries you have had.  Any medical conditions you have. What are the risks? Generally, this is a safe procedure. However, problems can occur and include:  A decrease in parathyroid hormone levels (hypoparathyroidism). Your parathyroid glands are located behind your thyroid gland, and they maintain the calcium level in your body. If these glands are damaged during surgery, your calcium level will drop. This will make your nerves irritable and cause muscle spasms.  An increase in thyroid hormone.  Damage to the nerves of your voice box  (larynx).  Bleeding.  Infection at the site of the incision or incisions.  Temporary breathing difficulties. This is a very rare complication. It usually goes away within weeks.  What happens before the procedure?  Your health care provider will perform a physical exam and assess your voice for vocal changes.  Ask your health care provider about: ? Changing or stopping your regular medicines. This is especially important if you are taking diabetes medicines or blood thinners. ? Taking medicines such as aspirin and ibuprofen. These medicines can thin your blood. Do not take these medicines before your procedure if your health care provider instructs you not to.  Follow instructions from your health care provider about eating or drinking restrictions. What happens during the procedure? You will be given a medicine that makes you go to sleep (general anesthetic). Depending on which type of thyroidectomy you have, this is what may happen during the procedure: Conventional Thyroidectomy  The surgeon will make an incision in the center of your lower neck.  The muscles in your neck will be separated to reveal your thyroid gland.  Part or all of your thyroid gland will be removed.  You may need a tube (catheter) at the incision site to drain blood and fluids that accumulate under the skin after the procedure.The catheter may have to stay in place for a day or two after the procedure.  The incision will be closed with stitches (sutures). Endoscopic Thyroidectomy  The surgeon will make several small incisions in your neck, chest, or armpit.  The surgeon will use a narrow tube with a light and camera at the end (endoscope). The surgeon will insert the endoscope into an incision.  Part or   all of your thyroid gland will be removed.  You may need a catheter at the incision site to drain blood and fluids that accumulate under the skin after the procedure.The catheter may have to stay in  place for a day or two after the procedure.  The incision will be closed with sutures. What happens after the procedure?  Your blood pressure, heart rate, breathing rate, and blood oxygen level will be monitored often until the medicines you were given have worn off.  Depending on the type of thyroidectomy you had, you may have: ? A swollen neck. ? Some mild neck pain. ? A slightly sore throat. ? A weak voice.  You will not be able to eat or drink until your health care provider says it is okay.  You may have a blood test to check the level of calcium in your body.  If you had a catheter put in during the procedure, it will usually be removed the next day. This information is not intended to replace advice given to you by your health care provider. Make sure you discuss any questions you have with your health care provider. Document Released: 11/29/2000 Document Revised: 02/06/2016 Document Reviewed: 11/05/2013 Elsevier Interactive Patient Education  2018 [employer] Inc[employer] Thyroid Cancer The thyroid is a butterfly-shaped gland in the neck. The thyroid makes hormones that control functions like heart rate, blood pressure, temperature, and weight. Thyroid cancer is an abnormal growth of cells in the thyroid. These cells grow and multiply rapidly and do not die as normal cells do. There are four main types of thyroid cancer:  Papillary cancer. This is the least harmful type of thyroid cancer. It typically affects women of child-bearing age.  Follicular cancer. This type of cancer is the most likely to come back after treatment and spread to other parts of the body.  Medullary cancer. Medullary cancer can be passed down through families. A person who inherits the gene for this cancer is at high risk for developing the cancer.  Anaplastic cancer. This type of thyroid cancer spreads quickly to the windpipe (trachea) and causes breathing problems. This type of cancer is most common in people 65  years of age or older.  What are the causes? The cause of thyroid cancer is not known. What increases the risk? Risk factors for thyroid cancer include:  Radiation exposure or radiation treatments to your head and neck during infancy or childhood.  Having an enlarged thyroid.  Having a family history of thyroid disease or inheriting family conditions.  Being male.  Being Asian.  What are the signs or symptoms? Symptoms of thyroid cancer may include:  Larger-than-normal thyroid gland.  Lumps or swelling in your neck.  Hoarseness or a change in your voice.  A cough.  Coughing up blood.  Difficulty swallowing.  Shortness of breath.  How is this diagnosed? Your health care provider will examine your thyroid during a physical exam. He or she may order tests such as:  An imaging study using sound waves and a computer (ultrasound).  Blood tests.  A tissue sample test (biopsy).  Other tests may be ordered to see if your cancer has spread. These tests may include CT, MRI, or PET scans. Your health care provider may see if you have any genetic changes that may put you at risk for other types of cancer. How is this treated? Most thyroid cancers are treated with a surgery to remove most or all of the thyroid gland (thyroidectomy). In some   cases, treatment also involves removing the lymph nodes in the neck that are close to the thyroid. After surgery, you may have:  Thyroid hormone therapy. The therapy is done to: ? Replace the hormone in the body that is normally made by the thyroid. ? Suppress a thyroid-stimulating hormone (TSH) that activates the thyroid and makes any remaining cancer cells grow.  Radioactive iodine treatment. This treatment is often used to destroy any remaining thyroid tissue and cancerous tissue that could not be seen and was not removed during the thyroidectomy. This treatment may also be used to treat thyroid cancer that has spread to other parts of  the body or has recurred.  External radiation. This is typically done to treat thyroid cancer that has spread to the bones.  Chemotherapy treatment. This is medicine that kills cancer cells and prevents them from growing.  Alcohol ablation. This kills cancer cells by injecting alcohol into the cells.  Biologic therapy or immunotherapy. This uses the body's own immune system to fight cancer cells.  Follow these instructions at home:  Take medicines only as directed by your health care provider.  Keep all follow-up visits as directed by your health care provider. This is important. Contact a health care provider if:  You feel nauseous or you vomit.  You have diarrhea.  You have a rash.  You have problems with urinating, such as: ? Having a burning sensation. ? Needing to urinate more often than usual. ? Pain or difficulty urinating. ? Having blood in your urine.  You have a new cough.  You have symptoms of too much thyroid hormone, such as: ? Nervousness or anxiety. ? Unintentional weight loss. ? Sweating. ? Difficulty sleeping. ? Hair loss. ? Heart palpitations. ? Frequent bowel movements.  You have symptoms of too little thyroid hormone, such as: ? Fatigue. ? Puffiness in your face, hands, or feet. ? Unintentional weight gain. ? Feeling cold. ? Constipation.  You have a fever. Get help right away if:  You have chest pain.  You have shortness of breath.  You suddenly feel too weak or dizzy to stand or walk. This information is not intended to replace advice given to you by your health care provider. Make sure you discuss any questions you have with your health care provider. Document Released: 04/28/2004 Document Revised: 02/06/2016 Document Reviewed: 11/06/2013 Elsevier Interactive Patient Education  2018 Elsevier Inc[employer]  

## 2017-12-28 NOTE — H&P (Signed)
Angel Chambers; [id number]; Nov 08, 1955   HPI Patient is a 62 year old white male who was referred to my care by Dr. Delton Coombes for evaluation and treatment of thyroid cancer.  He was originally found to have an enlarged left lobe of the thyroid gland on physical examination.  Ultrasound revealed 2 nodules in the left lobe.  Fine-needle aspiration of both revealed papillary carcinoma.  Patient denies any history of neck irradiation, family history of thyroid cancer, heat intolerance, voice changes, or weight loss.  He currently has no neck pain. Past Medical History:  Diagnosis Date  . Thyroid cancer [facility])     Past Surgical History:  Procedure Laterality Date  . WRIST FRACTURE SURGERY  2004    Family History  Problem Relation Age of Onset  . Diabetes Mother   . Kidney disease Mother   . Heart attack Father   . Cancer Sister        hysterectomy due to cancer  . Hypertension Brother   . Heart disease Brother   . Thyroid disease Brother     No current outpatient medications on file prior to visit.   No current facility-administered medications on file prior to visit.     No Known Allergies  Social History   Substance and Sexual Activity  Alcohol Use No    Social History   Tobacco Use  Smoking Status Never Smoker  Smokeless Tobacco Never Used    Review of Systems  Constitutional: Negative.   HENT: Negative.   Eyes: Negative.   Respiratory: Negative.   Cardiovascular: Negative.   Gastrointestinal: Negative.   Genitourinary: Negative.   Musculoskeletal: Negative.   Skin: Negative.   Neurological: Negative.   Endo/Heme/Allergies: Negative.   Psychiatric/Behavioral: Negative.     Objective   Vitals:   12/27/17 1110  BP: (!) 146/88  Pulse: 66  Resp: 16  Temp: 97.8 F (36.6 C)    Physical Exam  Constitutional: He is oriented to person, place, and time. He appears well-developed and well-nourished. No distress.  HENT:  Head: Normocephalic and atraumatic.   Neck: Normal range of motion. Neck supple. No tracheal deviation present. Thyromegaly present.  Enlarged nodular left lobe of thyroid gland.  Right lobe smooth.  Cardiovascular: Normal rate, regular rhythm and normal heart sounds. Exam reveals no gallop and no friction rub.  No murmur heard. Pulmonary/Chest: Effort normal and breath sounds normal. No stridor. No respiratory distress. He has no wheezes. He has no rales.  Lymphadenopathy:    He has no cervical adenopathy.  Neurological: He is alert and oriented to person, place, and time.  Skin: Skin is warm and dry.  Vitals reviewed.  Dr. Tomie China notes reviewed.  Ultrasound and pathology reports reviewed. Assessment  Papillary carcinoma of thyroid gland Plan   Patient is scheduled for a total thyroidectomy on 01/14/2018.  The risks and benefits of the procedure including bleeding, infection, voice changes, nerve injury, and hypocalcemia were fully explained to the patient, who gave informed consent.  He does realize that he will be on thyroid hormone supplementation for the rest of his life.

## 2018-01-07 NOTE — Patient Instructions (Signed)
Angel Chambers  01/07/2018     @PREFPERIOPPHARMACY @   Your procedure is scheduled on  01/14/2018 .  Report to [facility] at  615   A.M.  Call this number if you have problems the morning of surgery:  [phone]   Remember:  Do not eat or drink after midnight.  You may drink clear liquids until  12 midnight 01/13/2018  .  Clear liquids allowed are:                    Water, Juice (non-citric and without pulp), Carbonated beverages, Clear Tea, Black Coffee only, Plain Jell-O only, Gatorade and Plain Popsicles only    Take these medicines the morning of surgery with A SIP OF WATER  None    Do not wear jewelry, make-up or nail polish.  Do not wear lotions, powders, or perfumes, or deodorant.  Do not shave 48 hours prior to surgery.  Men may shave face and neck.  Do not bring valuables to the hospital.  [facility] is not responsible for any belongings or valuables.  Contacts, dentures or bridgework may not be worn into surgery.  Leave your suitcase in the car.  After surgery it may be brought to your room.  For patients admitted to the hospital, discharge time will be determined by your treatment team.  Patients discharged the day of surgery will not be allowed to drive home.   Name and phone number of your driver:   family Special instructions:  None  Please read over the following fact sheets that you were given. Pain Booklet, Coughing and Deep Breathing, Blood Transfusion Information, Lab Information, Surgical Site Infection Prevention, Anesthesia Post-op Instructions and Care and Recovery After Surgery       Thyroidectomy A thyroidectomy is a surgery that is done to remove all (total thyroidectomy) or part (subtotal thyroidectomy) of your thyroid gland. The thyroid is a butterfly-shaped gland that is located at the lower front of your neck. It produces a substance that helps to control certain body processes (thyroid hormone). The amount of thyroid gland  tissue that is removed during your thyroidectomy depends on the reason you need the procedure. You may have a thyroidectomy to treat conditions including:  Thyroid nodules.  Thyroid cancer.  Benign thyroid tumors.  Goiter.  Overactive thyroid gland (hyperthyroidism).  There are different ways to do a thyroidectomy:  Conventional thyroidectomy (open thyroidectomy). This procedure is the most common. In this procedure, the thyroid gland is removed through one surgical cut (incision) in the neck.  Endoscopic thyroidectomy. This procedure is less invasive. In this procedure, there may be several smaller incisions in the neck, chest, or armpit. The surgeon uses a tiny camera and other assistive tools to remove the thyroid gland.  Tell a health care provider about:  Any allergies you have.  All medicines you are taking, including vitamins, herbs, eye drops, creams, and over-the-counter medicines.  Any problems you or family members have had with anesthetic medicines.  Any blood disorders you have.  Any surgeries you have had.  Any medical conditions you have. What are the risks? Generally, this is a safe procedure. However, problems can occur and include:  A decrease in parathyroid hormone levels (hypoparathyroidism). Your parathyroid glands are located behind your thyroid gland, and they maintain the calcium level in your body. If these glands are damaged during surgery, your calcium level will drop. This will make your  nerves irritable and cause muscle spasms.  An increase in thyroid hormone.  Damage to the nerves of your voice box (larynx).  Bleeding.  Infection at the site of the incision or incisions.  Temporary breathing difficulties. This is a very rare complication. It usually goes away within weeks.  What happens before the procedure?  Your health care provider will perform a physical exam and assess your voice for vocal changes.  Ask your health care provider  about: ? Changing or stopping your regular medicines. This is especially important if you are taking diabetes medicines or blood thinners. ? Taking medicines such as aspirin and ibuprofen. These medicines can thin your blood. Do not take these medicines before your procedure if your health care provider instructs you not to.  Follow instructions from your health care provider about eating or drinking restrictions. What happens during the procedure? You will be given a medicine that makes you go to sleep (general anesthetic). Depending on which type of thyroidectomy you have, this is what may happen during the procedure: Conventional Thyroidectomy  The surgeon will make an incision in the center of your lower neck.  The muscles in your neck will be separated to reveal your thyroid gland.  Part or all of your thyroid gland will be removed.  You may need a tube (catheter) at the incision site to drain blood and fluids that accumulate under the skin after the procedure.The catheter may have to stay in place for a day or two after the procedure.  The incision will be closed with stitches (sutures). Endoscopic Thyroidectomy  The surgeon will make several small incisions in your neck, chest, or armpit.  The surgeon will use a narrow tube with a light and camera at the end (endoscope). The surgeon will insert the endoscope into an incision.  Part or all of your thyroid gland will be removed.  You may need a catheter at the incision site to drain blood and fluids that accumulate under the skin after the procedure.The catheter may have to stay in place for a day or two after the procedure.  The incision will be closed with sutures. What happens after the procedure?  Your blood pressure, heart rate, breathing rate, and blood oxygen level will be monitored often until the medicines you were given have worn off.  Depending on the type of thyroidectomy you had, you may have: ? A swollen  neck. ? Some mild neck pain. ? A slightly sore throat. ? A weak voice.  You will not be able to eat or drink until your health care provider says it is okay.  You may have a blood test to check the level of calcium in your body.  If you had a catheter put in during the procedure, it will usually be removed the next day. This information is not intended to replace advice given to you by your health care provider. Make sure you discuss any questions you have with your health care provider. Document Released: 11/29/2000 Document Revised: 02/06/2016 Document Reviewed: 11/05/2013 Elsevier Interactive Patient Education  2018 [place]. Thyroidectomy, Care After Refer to this sheet in the next few weeks. These instructions provide you with information about caring for yourself after your procedure. Your health care provider may also give you more specific instructions. Your treatment has been planned according to current medical practices, but problems sometimes occur. Call your health care provider if you have any problems or questions after your procedure. What can I expect after the  procedure? After your procedure, it is typical to have:  Mild pain in the neck or upper body, especially when swallowing.  A sore throat.  A weak voice.  Follow these instructions at home:  Take medicines only as directed by your health care provider.  If your entire thyroid gland was removed, you may need to take thyroid hormone medicine from now on.  Do not take medicines that contain aspirin and ibuprofen until your health care provider says that you can. These medicines can increase your risk of bleeding.  Some pain medicines cause constipation. Drink enough fluid to keep your urine clear or pale yellow. This can help to prevent constipation.  Start slowly with eating. You may need to have only liquids and soft foods for a few days or as directed by your health care provider.  Do not take baths,  swim, or use a hot tub until your health care provider approves.  There are many different ways to close and cover an incision, including stitches (sutures), skin glue, and adhesive strips. Follow your health care provider's instructions for: ? Incision care. ? Bandage (dressing) changes and removal. ? Incision closure removal.  Resume your usual activities as directed by your health care provider.  For the first 10 days after the procedure or as instructed by your health care provider: ? Do not lift anything heavier than 20 lb (9.1 kg). ? Do not jog, swim, or do other strenuous exercises. ? Do not play contact sports.  Keep all follow-up visits as directed by your health care provider. This is important. Contact a health care provider if:  The soreness in your throat gets worse.  You have increased pain at your incision or incisions.  You have increased bleeding from an incision.  Your incision becomes infected. Watch for: ? Swelling. ? Redness. ? Warmth. ? Pus.  You notice a bad smell coming from an incision or dressing.  You have a fever.  You feel lightheaded or faint.  You have numbness, tingling, or muscle spasms in your: ? Arms. ? Hands. ? Feet. ? Face.  You have trouble swallowing. Get help right away if:  You develop a rash.  You have difficulty breathing.  You hear whistling noises coming from your chest.  You develop a cough that gets worse.  Your speech changes, or you have hoarseness that gets worse. This information is not intended to replace advice given to you by your health care provider. Make sure you discuss any questions you have with your health care provider. Document Released: 12/23/2004 Document Revised: 02/06/2016 Document Reviewed: 11/05/2013 Elsevier Interactive Patient Education  2018 [place] Anesthesia, Adult General anesthesia is the use of medicines to make a person "go to sleep" (be unconscious) for a medical  procedure. General anesthesia is often recommended when a procedure:  Is long.  Requires you to be still or in an unusual position.  Is major and can cause you to lose blood.  Is impossible to do without general anesthesia.  The medicines used for general anesthesia are called general anesthetics. In addition to making you sleep, the medicines:  Prevent pain.  Control your blood pressure.  Relax your muscles.  Tell a health care provider about:  Any allergies you have.  All medicines you are taking, including vitamins, herbs, eye drops, creams, and over-the-counter medicines.  Any problems you or family members have had with anesthetic medicines.  Types of anesthetics you have had in the past.  Any  bleeding disorders you have.  Any surgeries you have had.  Any medical conditions you have.  Any history of heart or lung conditions, such as heart failure, sleep apnea, or chronic obstructive pulmonary disease (COPD).  Whether you are pregnant or may be pregnant.  Whether you use tobacco, alcohol, marijuana, or street drugs.  Any history of Armed forces logistics/support/administrative officer.  Any history of depression or anxiety. What are the risks? Generally, this is a safe procedure. However, problems may occur, including:  Allergic reaction to anesthetics.  Lung and heart problems.  Inhaling food or liquids from your stomach into your lungs (aspiration).  Injury to nerves.  Waking up during your procedure and being unable to move (rare).  Extreme agitation or a state of mental confusion (delirium) when you wake up from the anesthetic.  Air in the bloodstream, which can lead to stroke.  These problems are more likely to develop if you are having a major surgery or if you have an advanced medical condition. You can prevent some of these complications by answering all of your health care provider's questions thoroughly and by following all pre-procedure instructions. General anesthesia can  cause side effects, including:  Nausea or vomiting  A sore throat from the breathing tube.  Feeling cold or shivery.  Feeling tired, washed out, or achy.  Sleepiness or drowsiness.  Confusion or agitation.  What happens before the procedure? Staying hydrated Follow instructions from your health care provider about hydration, which may include:  Up to 2 hours before the procedure - you may continue to drink clear liquids, such as water, clear fruit juice, black coffee, and plain tea.  Eating and drinking restrictions Follow instructions from your health care provider about eating and drinking, which may include:  8 hours before the procedure - stop eating heavy meals or foods such as meat, fried foods, or fatty foods.  6 hours before the procedure - stop eating light meals or foods, such as toast or cereal.  6 hours before the procedure - stop drinking milk or drinks that contain milk.  2 hours before the procedure - stop drinking clear liquids.  Medicines  Ask your health care provider about: ? Changing or stopping your regular medicines. This is especially important if you are taking diabetes medicines or blood thinners. ? Taking medicines such as aspirin and ibuprofen. These medicines can thin your blood. Do not take these medicines before your procedure if your health care provider instructs you not to. ? Taking new dietary supplements or medicines. Do not take these during the week before your procedure unless your health care provider approves them.  If you are told to take a medicine or to continue taking a medicine on the day of the procedure, take the medicine with sips of water. General instructions   Ask if you will be going home the same day, the following day, or after a longer hospital stay. ? Plan to have someone take you home. ? Plan to have someone stay with you for the first 24 hours after you leave the hospital or clinic.  For 3-6 weeks before the  procedure, try not to use any tobacco products, such as cigarettes, chewing tobacco, and e-cigarettes.  You may brush your teeth on the morning of the procedure, but make sure to spit out the toothpaste. What happens during the procedure?  You will be given anesthetics through a mask and through an IV tube in one of your veins.  You may receive medicine to  help you relax (sedative).  As soon as you are asleep, a breathing tube may be used to help you breathe.  An anesthesia specialist will stay with you throughout the procedure. He or she will help keep you comfortable and safe by continuing to give you medicines and adjusting the amount of medicine that you get. He or she will also watch your blood pressure, pulse, and oxygen levels to make sure that the anesthetics do not cause any problems.  If a breathing tube was used to help you breathe, it will be removed before you wake up. The procedure may vary among health care providers and hospitals. What happens after the procedure?  You will wake up, often slowly, after the procedure is complete, usually in a recovery area.  Your blood pressure, heart rate, breathing rate, and blood oxygen level will be monitored until the medicines you were given have worn off.  You may be given medicine to help you calm down if you feel anxious or agitated.  If you will be going home the same day, your health care provider may check to make sure you can stand, drink, and urinate.  Your health care providers will treat your pain and side effects before you go home.  Do not drive for 24 hours if you received a sedative.  You may: ? Feel nauseous and vomit. ? Have a sore throat. ? Have mental slowness. ? Feel cold or shivery. ? Feel sleepy. ? Feel tired. ? Feel sore or achy, even in parts of your body where you did not have surgery. This information is not intended to replace advice given to you by your health care provider. Make sure you discuss  any questions you have with your health care provider. Document Released: 09/12/2007 Document Revised: 11/16/2015 Document Reviewed: 05/20/2015 Elsevier Interactive Patient Education  2018 [place] Anesthesia, Adult, Care After These instructions provide you with information about caring for yourself after your procedure. Your health care provider may also give you more specific instructions. Your treatment has been planned according to current medical practices, but problems sometimes occur. Call your health care provider if you have any problems or questions after your procedure. What can I expect after the procedure? After the procedure, it is common to have:  Vomiting.  A sore throat.  Mental slowness.  It is common to feel:  Nauseous.  Cold or shivery.  Sleepy.  Tired.  Sore or achy, even in parts of your body where you did not have surgery.  Follow these instructions at home: For at least 24 hours after the procedure:  Do not: ? Participate in activities where you could fall or become injured. ? Drive. ? Use heavy machinery. ? Drink alcohol. ? Take sleeping pills or medicines that cause drowsiness. ? Make important decisions or sign legal documents. ? Take care of children on your own.  Rest. Eating and drinking  If you vomit, drink water, juice, or soup when you can drink without vomiting.  Drink enough fluid to keep your urine clear or pale yellow.  Make sure you have little or no nausea before eating solid foods.  Follow the diet recommended by your health care provider. General instructions  Have a responsible adult stay with you until you are awake and alert.  Return to your normal activities as told by your health care provider. Ask your health care provider what activities are safe for you.  Take over-the-counter and prescription medicines only as told by your  health care provider.  If you smoke, do not smoke without  supervision.  Keep all follow-up visits as told by your health care provider. This is important. Contact a health care provider if:  You continue to have nausea or vomiting at home, and medicines are not helpful.  You cannot drink fluids or start eating again.  You cannot urinate after 8-12 hours.  You develop a skin rash.  You have fever.  You have increasing redness at the site of your procedure. Get help right away if:  You have difficulty breathing.  You have chest pain.  You have unexpected bleeding.  You feel that you are having a life-threatening or urgent problem. This information is not intended to replace advice given to you by your health care provider. Make sure you discuss any questions you have with your health care provider. Document Released: 09/11/2000 Document Revised: 11/08/2015 Document Reviewed: 05/20/2015 Elsevier Interactive Patient Education  [employer].

## 2018-01-09 ENCOUNTER — Encounter (HOSPITAL_COMMUNITY)
Admission: RE | Admit: 2018-01-09 | Discharge: 2018-01-09 | Disposition: A | Discharge status: Home / Self Care | Payer: BLUE CROSS/BLUE SHIELD | Source: Ambulatory Visit | Attending: General Surgery | Admitting: General Surgery

## 2018-01-09 ENCOUNTER — Encounter (HOSPITAL_COMMUNITY): Payer: Self-pay

## 2018-01-09 ENCOUNTER — Other Ambulatory Visit: Payer: Self-pay

## 2018-01-09 ENCOUNTER — Ambulatory Visit (HOSPITAL_COMMUNITY): Payer: BLUE CROSS/BLUE SHIELD

## 2018-01-09 ENCOUNTER — Ambulatory Visit (HOSPITAL_COMMUNITY)
Admission: RE | Admit: 2018-01-09 | Discharge: 2018-01-09 | Disposition: A | Discharge status: Home / Self Care | Payer: BLUE CROSS/BLUE SHIELD | Source: Ambulatory Visit | Attending: General Surgery | Admitting: General Surgery

## 2018-01-09 DIAGNOSIS — Z01810 Encounter for preprocedural cardiovascular examination: Secondary | ICD-10-CM | POA: Diagnosis not present

## 2018-01-09 DIAGNOSIS — Z01818 Encounter for other preprocedural examination: Secondary | ICD-10-CM | POA: Diagnosis not present

## 2018-01-09 DIAGNOSIS — C73 Malignant neoplasm of thyroid gland: Secondary | ICD-10-CM

## 2018-01-09 DIAGNOSIS — I498 Other specified cardiac arrhythmias: Secondary | ICD-10-CM | POA: Insufficient documentation

## 2018-01-09 DIAGNOSIS — Z01812 Encounter for preprocedural laboratory examination: Secondary | ICD-10-CM | POA: Insufficient documentation

## 2018-01-09 HISTORY — DX: Personal history of urinary calculi: Z87.442

## 2018-01-09 LAB — COMPREHENSIVE METABOLIC PANEL
ALBUMIN: 4.1 g/dL (ref 3.5–5.0)
ALT: 17 U/L (ref 0–44)
AST: 17 U/L (ref 15–41)
Alkaline Phosphatase: 70 U/L (ref 38–126)
Anion gap: 7 (ref 5–15)
BUN: 14 mg/dL (ref 8–23)
CALCIUM: 9.1 mg/dL (ref 8.9–10.3)
CHLORIDE: 105 mmol/L (ref 98–111)
CO2: 26 mmol/L (ref 22–32)
Creatinine, Ser: 0.79 mg/dL (ref 0.61–1.24)
GFR calc non Af Amer: >60 mL/min (ref >60)
Glucose, Bld: 109 mg/dL — ABNORMAL HIGH (ref 70–99)
POTASSIUM: 3.9 mmol/L (ref 3.5–5.1)
SODIUM: 138 mmol/L (ref 135–145)
Total Bilirubin: 0.7 mg/dL (ref 0.3–1.2)
Total Protein: 7.2 g/dL (ref 6.5–8.1)

## 2018-01-09 LAB — CBC WITH DIFFERENTIAL/PLATELET
BASOS PCT: 0 %
Basophils Absolute: 0 10*3/uL (ref 0.0–0.1)
EOS ABS: 0.1 10*3/uL (ref 0.0–0.7)
EOS PCT: 2 %
HCT: 44.9 % (ref 39.0–52.0)
Hemoglobin: 15.6 g/dL (ref 13.0–17.0)
LYMPHS ABS: 1.5 10*3/uL (ref 0.7–4.0)
Lymphocytes Relative: 28 %
MCH: 32.4 pg (ref 26.0–34.0)
MCHC: 34.7 g/dL (ref 30.0–36.0)
MCV: 93.3 fL (ref 78.0–100.0)
MONOS PCT: 6 %
Monocytes Absolute: 0.3 10*3/uL (ref 0.1–1.0)
Neutro Abs: 3.4 10*3/uL (ref 1.7–7.7)
Neutrophils Relative %: 64 %
PLATELETS: 178 10*3/uL (ref 150–400)
RBC: 4.81 MIL/uL (ref 4.22–5.81)
RDW: 12.7 % (ref 11.5–15.5)
WBC: 5.4 10*3/uL (ref 4.0–10.5)

## 2018-01-09 LAB — TYPE AND SCREEN
ABO/RH(D): O POS
ANTIBODY SCREEN: NEGATIVE

## 2018-01-09 LAB — TSH: TSH: 1.143 u[IU]/mL (ref 0.350–4.500)

## 2018-01-10 LAB — T3: T3 TOTAL: 114 ng/dL (ref 71–180)

## 2018-01-10 LAB — T4: T4 TOTAL: 6.3 ug/dL (ref 4.5–12.0)

## 2018-01-14 ENCOUNTER — Encounter (HOSPITAL_COMMUNITY): Payer: Self-pay

## 2018-01-14 ENCOUNTER — Other Ambulatory Visit: Payer: Self-pay

## 2018-01-14 ENCOUNTER — Ambulatory Visit (HOSPITAL_COMMUNITY): Payer: BLUE CROSS/BLUE SHIELD | Admitting: Anesthesiology

## 2018-01-14 ENCOUNTER — Encounter (HOSPITAL_COMMUNITY)
Admission: RE | Disposition: A | Discharge status: Home / Self Care | Payer: Self-pay | Source: Ambulatory Visit | Attending: General Surgery

## 2018-01-14 ENCOUNTER — Observation Stay (HOSPITAL_COMMUNITY)
Admission: RE | Admit: 2018-01-14 | Discharge: 2018-01-15 | Disposition: A | Discharge status: Home / Self Care | Payer: BLUE CROSS/BLUE SHIELD | Source: Ambulatory Visit | Attending: General Surgery | Admitting: General Surgery

## 2018-01-14 DIAGNOSIS — C73 Malignant neoplasm of thyroid gland: Secondary | ICD-10-CM | POA: Diagnosis not present

## 2018-01-14 DIAGNOSIS — Z9089 Acquired absence of other organs: Secondary | ICD-10-CM

## 2018-01-14 DIAGNOSIS — E890 Postprocedural hypothyroidism: Secondary | ICD-10-CM

## 2018-01-14 DIAGNOSIS — Z9889 Other specified postprocedural states: Secondary | ICD-10-CM

## 2018-01-14 HISTORY — DX: Postprocedural hypothyroidism: E89.0

## 2018-01-14 HISTORY — PX: THYROIDECTOMY: SHX17

## 2018-01-14 HISTORY — DX: Other specified postprocedural states: Z98.890

## 2018-01-14 LAB — COMPREHENSIVE METABOLIC PANEL
ALT: 17 U/L (ref 0–44)
ANION GAP: 6 (ref 5–15)
AST: 19 U/L (ref 15–41)
Albumin: 3.8 g/dL (ref 3.5–5.0)
Alkaline Phosphatase: 66 U/L (ref 38–126)
BUN: 15 mg/dL (ref 8–23)
CHLORIDE: 109 mmol/L (ref 98–111)
CO2: 25 mmol/L (ref 22–32)
Calcium: 8.6 mg/dL — ABNORMAL LOW (ref 8.9–10.3)
Creatinine, Ser: 1.04 mg/dL (ref 0.61–1.24)
Glucose, Bld: 103 mg/dL — ABNORMAL HIGH (ref 70–99)
Potassium: 3.9 mmol/L (ref 3.5–5.1)
SODIUM: 140 mmol/L (ref 135–145)
Total Bilirubin: 0.5 mg/dL (ref 0.3–1.2)
Total Protein: 6.5 g/dL (ref 6.5–8.1)

## 2018-01-14 SURGERY — THYROIDECTOMY
Anesthesia: General | Site: Neck

## 2018-01-14 MED ORDER — HYDROMORPHONE HCL 1 MG/ML IJ SOLN: 0.2500 mg | INTRAMUSCULAR | Status: DC | PRN

## 2018-01-14 MED ORDER — FENTANYL CITRATE (PF) 100 MCG/2ML IJ SOLN
INTRAMUSCULAR | Status: AC
  Filled 2018-01-14: qty 2

## 2018-01-14 MED ORDER — MENTHOL 3 MG MT LOZG
1.0000 | LOZENGE | OROMUCOSAL | Status: DC | PRN
Start: 2018-01-14 — End: 2018-01-15
  Administered 2018-01-14: 3 mg via ORAL
  Filled 2018-01-14: qty 9

## 2018-01-14 MED ORDER — ONDANSETRON HCL 4 MG/2ML IJ SOLN: 4.0000 mg | Freq: Four times a day (QID) | INTRAMUSCULAR | Status: DC | PRN

## 2018-01-14 MED ORDER — HYDROCODONE-ACETAMINOPHEN 5-325 MG PO TABS
1.0000 | ORAL_TABLET | ORAL | Status: DC | PRN
  Administered 2018-01-15: 1 via ORAL
  Filled 2018-01-14: qty 1

## 2018-01-14 MED ORDER — BUPIVACAINE LIPOSOME 1.3 % IJ SUSP
INTRAMUSCULAR | Status: DC | PRN
  Administered 2018-01-14: 8 mL

## 2018-01-14 MED ORDER — ONDANSETRON 4 MG PO TBDP: 4.0000 mg | ORAL_TABLET | Freq: Four times a day (QID) | ORAL | Status: DC | PRN

## 2018-01-14 MED ORDER — SUCCINYLCHOLINE CHLORIDE 20 MG/ML IJ SOLN
INTRAMUSCULAR | Status: DC | PRN
  Administered 2018-01-14: 120 mg via INTRAVENOUS

## 2018-01-14 MED ORDER — ROCURONIUM BROMIDE 50 MG/5ML IV SOLN
INTRAVENOUS | Status: AC
  Filled 2018-01-14: qty 1

## 2018-01-14 MED ORDER — LISINOPRIL 5 MG PO TABS
5.0000 mg | ORAL_TABLET | Freq: Every day | ORAL | Status: DC
  Administered 2018-01-14 – 2018-01-15 (×2): 5 mg via ORAL
  Filled 2018-01-14 (×2): qty 1

## 2018-01-14 MED ORDER — FENTANYL CITRATE (PF) 100 MCG/2ML IJ SOLN
INTRAMUSCULAR | Status: DC | PRN
  Administered 2018-01-14: 50 ug via INTRAVENOUS
  Administered 2018-01-14 (×2): 25 ug via INTRAVENOUS

## 2018-01-14 MED ORDER — EPHEDRINE SULFATE 50 MG/ML IJ SOLN
INTRAMUSCULAR | Status: AC
  Filled 2018-01-14: qty 1

## 2018-01-14 MED ORDER — DIPHENHYDRAMINE HCL 50 MG/ML IJ SOLN: 25.0000 mg | Freq: Four times a day (QID) | INTRAMUSCULAR | Status: DC | PRN

## 2018-01-14 MED ORDER — KETOROLAC TROMETHAMINE 30 MG/ML IJ SOLN
INTRAMUSCULAR | Status: AC
  Filled 2018-01-14: qty 1

## 2018-01-14 MED ORDER — ONDANSETRON HCL 4 MG/2ML IJ SOLN
INTRAMUSCULAR | Status: DC | PRN
  Administered 2018-01-14: 4 mg via INTRAVENOUS

## 2018-01-14 MED ORDER — SUCCINYLCHOLINE CHLORIDE 20 MG/ML IJ SOLN
INTRAMUSCULAR | Status: AC
  Filled 2018-01-14: qty 1

## 2018-01-14 MED ORDER — MEPERIDINE HCL 50 MG/ML IJ SOLN: 6.2500 mg | INTRAMUSCULAR | Status: DC | PRN

## 2018-01-14 MED ORDER — HYDROCODONE-ACETAMINOPHEN 7.5-325 MG PO TABS: 1.0000 | ORAL_TABLET | Freq: Once | ORAL | Status: DC | PRN

## 2018-01-14 MED ORDER — CEFAZOLIN SODIUM-DEXTROSE 2-4 GM/100ML-% IV SOLN
2.0000 g | INTRAVENOUS | Status: AC
  Administered 2018-01-14: 2 g via INTRAVENOUS
  Filled 2018-01-14: qty 100

## 2018-01-14 MED ORDER — ACETAMINOPHEN 325 MG PO TABS: 650.0000 mg | ORAL_TABLET | Freq: Four times a day (QID) | ORAL | Status: DC | PRN

## 2018-01-14 MED ORDER — MIDAZOLAM HCL 2 MG/2ML IJ SOLN
INTRAMUSCULAR | Status: AC
  Filled 2018-01-14: qty 2

## 2018-01-14 MED ORDER — LACTATED RINGERS IV SOLN: INTRAVENOUS | Status: DC

## 2018-01-14 MED ORDER — PROPOFOL 10 MG/ML IV BOLUS
INTRAVENOUS | Status: AC
  Filled 2018-01-14: qty 20

## 2018-01-14 MED ORDER — SODIUM CHLORIDE 0.9 % IJ SOLN
INTRAMUSCULAR | Status: AC
  Filled 2018-01-14: qty 10

## 2018-01-14 MED ORDER — LACTATED RINGERS IV SOLN
INTRAVENOUS | Status: DC
  Administered 2018-01-14 – 2018-01-15 (×3): via INTRAVENOUS

## 2018-01-14 MED ORDER — PROPOFOL 10 MG/ML IV BOLUS
INTRAVENOUS | Status: DC | PRN
  Administered 2018-01-14: 200 mg via INTRAVENOUS

## 2018-01-14 MED ORDER — KETOROLAC TROMETHAMINE 30 MG/ML IJ SOLN
30.0000 mg | Freq: Four times a day (QID) | INTRAMUSCULAR | Status: AC
  Administered 2018-01-14: 30 mg via INTRAVENOUS

## 2018-01-14 MED ORDER — LACTATED RINGERS IV SOLN
INTRAVENOUS | Status: DC
  Administered 2018-01-14: 1000 mL via INTRAVENOUS
  Administered 2018-01-14: 09:00:00 via INTRAVENOUS

## 2018-01-14 MED ORDER — CALCIUM CARBONATE-VITAMIN D 500-200 MG-UNIT PO TABS
2.0000 | ORAL_TABLET | Freq: Two times a day (BID) | ORAL | Status: DC
  Administered 2018-01-14 – 2018-01-15 (×3): 2 via ORAL
  Filled 2018-01-14 (×3): qty 2

## 2018-01-14 MED ORDER — DIPHENHYDRAMINE HCL 25 MG PO CAPS: 25.0000 mg | ORAL_CAPSULE | Freq: Four times a day (QID) | ORAL | Status: DC | PRN

## 2018-01-14 MED ORDER — BUPIVACAINE LIPOSOME 1.3 % IJ SUSP
INTRAMUSCULAR | Status: AC
  Filled 2018-01-14: qty 20

## 2018-01-14 MED ORDER — ENOXAPARIN SODIUM 40 MG/0.4ML ~~LOC~~ SOLN
40.0000 mg | SUBCUTANEOUS | Status: DC
  Administered 2018-01-15: 40 mg via SUBCUTANEOUS
  Filled 2018-01-14: qty 0.4

## 2018-01-14 MED ORDER — LEVOTHYROXINE SODIUM 100 MCG PO TABS
100.0000 ug | ORAL_TABLET | Freq: Every day | ORAL | Status: DC
  Administered 2018-01-15: 100 ug via ORAL
  Filled 2018-01-14: qty 1

## 2018-01-14 MED ORDER — KETOROLAC TROMETHAMINE 30 MG/ML IJ SOLN
30.0000 mg | Freq: Four times a day (QID) | INTRAMUSCULAR | Status: DC | PRN
  Filled 2018-01-14: qty 1

## 2018-01-14 MED ORDER — CHLORHEXIDINE GLUCONATE CLOTH 2 % EX PADS: 6.0000 | MEDICATED_PAD | Freq: Once | CUTANEOUS | Status: DC

## 2018-01-14 MED ORDER — HEMOSTATIC AGENTS (NO CHARGE) OPTIME
TOPICAL | Status: DC | PRN
  Administered 2018-01-14: 2 via TOPICAL

## 2018-01-14 MED ORDER — SUGAMMADEX SODIUM 200 MG/2ML IV SOLN
INTRAVENOUS | Status: AC
  Filled 2018-01-14: qty 2

## 2018-01-14 MED ORDER — SODIUM CHLORIDE 0.9 % IR SOLN
Status: DC | PRN
  Administered 2018-01-14: 1

## 2018-01-14 MED ORDER — PHENYLEPHRINE 40 MCG/ML (10ML) SYRINGE FOR IV PUSH (FOR BLOOD PRESSURE SUPPORT)
PREFILLED_SYRINGE | INTRAVENOUS | Status: AC
  Filled 2018-01-14: qty 10

## 2018-01-14 MED ORDER — HYDROMORPHONE HCL 1 MG/ML IJ SOLN: 1.0000 mg | INTRAMUSCULAR | Status: DC | PRN

## 2018-01-14 MED ORDER — ROCURONIUM BROMIDE 100 MG/10ML IV SOLN
INTRAVENOUS | Status: DC | PRN
  Administered 2018-01-14: 5 mg via INTRAVENOUS
  Administered 2018-01-14: 10 mg via INTRAVENOUS
  Administered 2018-01-14: 40 mg via INTRAVENOUS

## 2018-01-14 MED ORDER — ONDANSETRON HCL 4 MG/2ML IJ SOLN
INTRAMUSCULAR | Status: AC
  Filled 2018-01-14: qty 2

## 2018-01-14 MED ORDER — PROMETHAZINE HCL 25 MG/ML IJ SOLN: 6.2500 mg | INTRAMUSCULAR | Status: DC | PRN

## 2018-01-14 MED ORDER — ACETAMINOPHEN 650 MG RE SUPP: 650.0000 mg | Freq: Four times a day (QID) | RECTAL | Status: DC | PRN

## 2018-01-14 MED ORDER — MIDAZOLAM HCL 5 MG/5ML IJ SOLN
INTRAMUSCULAR | Status: DC | PRN
  Administered 2018-01-14: 2 mg via INTRAVENOUS

## 2018-01-14 MED ORDER — SUGAMMADEX SODIUM 200 MG/2ML IV SOLN
INTRAVENOUS | Status: DC | PRN
  Administered 2018-01-14 (×2): 100 mg via INTRAVENOUS

## 2018-01-14 MED ORDER — EPHEDRINE SULFATE 50 MG/ML IJ SOLN
INTRAMUSCULAR | Status: DC | PRN
  Administered 2018-01-14: 10 mg via INTRAVENOUS

## 2018-01-14 MED ORDER — SIMETHICONE 80 MG PO CHEW: 40.0000 mg | CHEWABLE_TABLET | Freq: Four times a day (QID) | ORAL | Status: DC | PRN

## 2018-01-14 SURGICAL SUPPLY — 53 items
APPLIER CLIP 11 MED OPEN (CLIP) ×2
APPLIER CLIP 9.375 SM OPEN (CLIP) ×4
ATTRACTOMAT 16X20 MAGNETIC DRP (DRAPES) ×2 IMPLANT
BLADE SURG 15 STRL LF DISP TIS (BLADE) ×1 IMPLANT
BLADE SURG 15 STRL SS (BLADE) ×1
BLADE SURG SZ10 CARB STEEL (BLADE) ×2 IMPLANT
CHLORAPREP W/TINT 10.5 ML (MISCELLANEOUS) ×2 IMPLANT
CLIP APPLIE 11 MED OPEN (CLIP) ×1 IMPLANT
CLIP APPLIE 9.375 SM OPEN (CLIP) ×2 IMPLANT
CLOTH BEACON ORANGE TIMEOUT ST (SAFETY) ×2 IMPLANT
COVER LIGHT HANDLE STERIS (MISCELLANEOUS) ×4 IMPLANT
DERMABOND ADVANCED (GAUZE/BANDAGES/DRESSINGS) ×1
DERMABOND ADVANCED .7 DNX12 (GAUZE/BANDAGES/DRESSINGS) ×1 IMPLANT
DRAPE LAPAROTOMY 77X122 PED (DRAPES) ×2 IMPLANT
DRAPE PROXIMA HALF (DRAPES) ×4 IMPLANT
ELECT NEEDLE TIP 2.8 STRL (NEEDLE) ×2 IMPLANT
ELECT REM PT RETURN 9FT ADLT (ELECTROSURGICAL) ×2
ELECTRODE REM PT RTRN 9FT ADLT (ELECTROSURGICAL) ×1 IMPLANT
GAUZE SPONGE 4X4 16PLY XRAY LF (GAUZE/BANDAGES/DRESSINGS) ×4 IMPLANT
GLOVE BIOGEL PI IND STRL 7.0 (GLOVE) ×2 IMPLANT
GLOVE BIOGEL PI INDICATOR 7.0 (GLOVE) ×2
GLOVE SURG SS PI 7.5 STRL IVOR (GLOVE) ×2 IMPLANT
GOWN STRL REUS W/ TWL LRG LVL3 (GOWN DISPOSABLE) ×2 IMPLANT
GOWN STRL REUS W/TWL LRG LVL3 (GOWN DISPOSABLE) ×4 IMPLANT
HEMOSTAT ARISTA ABSORB 1G (MISCELLANEOUS) ×2 IMPLANT
HEMOSTAT SURGICEL 4X8 (HEMOSTASIS) ×2 IMPLANT
KIT BLADEGUARD II DBL (SET/KITS/TRAYS/PACK) ×2 IMPLANT
KIT TURNOVER KIT A (KITS) ×2 IMPLANT
MANIFOLD NEPTUNE II (INSTRUMENTS) ×2 IMPLANT
MARKER SKIN DUAL TIP RULER LAB (MISCELLANEOUS) ×2 IMPLANT
NEEDLE HYPO 21X1.5 SAFETY (NEEDLE) ×2 IMPLANT
NS IRRIG 1000ML POUR BTL (IV SOLUTION) ×2 IMPLANT
PACK BASIC III (CUSTOM PROCEDURE TRAY) ×1
PACK SRG BSC III STRL LF ECLPS (CUSTOM PROCEDURE TRAY) ×1 IMPLANT
PAD ARMBOARD 7.5X6 YLW CONV (MISCELLANEOUS) ×2 IMPLANT
PENCIL HANDSWITCHING (ELECTRODE) ×2 IMPLANT
SET BASIN LINEN APH (SET/KITS/TRAYS/PACK) ×2 IMPLANT
SHEARS HARMONIC 9CM CVD (BLADE) ×2 IMPLANT
SPONGE DRAIN TRACH 4X4 STRL 2S (GAUZE/BANDAGES/DRESSINGS) ×2 IMPLANT
SPONGE INTESTINAL PEANUT (DISPOSABLE) ×10 IMPLANT
STAPLER VISISTAT 35W (STAPLE) ×2 IMPLANT
SUT ETHILON 4 0 PS 2 18 (SUTURE) IMPLANT
SUT MNCRL AB 4-0 PS2 18 (SUTURE) ×2 IMPLANT
SUT SILK 2 0 (SUTURE) ×1
SUT SILK 2-0 18XBRD TIE 12 (SUTURE) ×1 IMPLANT
SUT SILK 3 0 (SUTURE) ×1
SUT SILK 3-0 18XBRD TIE 12 (SUTURE) ×1 IMPLANT
SUT VIC AB 2-0 CT2 27 (SUTURE) ×2 IMPLANT
SUT VIC AB 3-0 SH 27 (SUTURE) ×1
SUT VIC AB 3-0 SH 27X BRD (SUTURE) ×1 IMPLANT
SYR 20CC LL (SYRINGE) ×2 IMPLANT
TOWEL OR 17X26 4PK STRL BLUE (TOWEL DISPOSABLE) ×2 IMPLANT
YANKAUER SUCT BULB TIP 10FT TU (MISCELLANEOUS) ×2 IMPLANT

## 2018-01-14 NOTE — Anesthesia Preprocedure Evaluation (Signed)
Anesthesia Evaluation  Patient identified by MRN, date of birth, ID band Patient awake    Reviewed: Allergy & Precautions, H&P , NPO status , Patient's Chart, lab work & pertinent test results, reviewed documented beta blocker date and time   Airway Mallampati: II  TM Distance: >3 FB Neck ROM: full    Dental no notable dental hx. (+) Loose   Pulmonary neg pulmonary ROS,    Pulmonary exam normal breath sounds clear to auscultation       Cardiovascular Exercise Tolerance: Good negative cardio ROS   Rhythm:regular Rate:Normal     Neuro/Psych negative neurological ROS  negative psych ROS   GI/Hepatic negative GI ROS, Neg liver ROS,   Endo/Other  negative endocrine ROS  Renal/GU negative Renal ROS  negative genitourinary   Musculoskeletal   Abdominal   Peds  Hematology negative hematology ROS (+)   Anesthesia Other Findings NSR at 64, sinus arrythmia,  Single loose lower front tooth Thyroid cancer with right sided thyroid lump  Reproductive/Obstetrics negative OB ROS                             Anesthesia Physical Anesthesia Plan  ASA: II  Anesthesia Plan: General   Post-op Pain Management:    Induction:   PONV Risk Score and Plan:   Airway Management Planned:   Additional Equipment:   Intra-op Plan:   Post-operative Plan:   Informed Consent: I have reviewed the patients History and Physical, chart, labs and discussed the procedure including the risks, benefits and alternatives for the proposed anesthesia with the patient or authorized representative who has indicated his/her understanding and acceptance.   Dental Advisory Given  Plan Discussed with: CRNA and Anesthesiologist  Anesthesia Plan Comments:         Anesthesia Quick Evaluation

## 2018-01-14 NOTE — Transfer of Care (Signed)
Immediate Anesthesia Transfer of Care Note  Patient: Angel Chambers  Procedure(s) Performed: TOTAL THYROIDECTOMY (N/A Neck)  Patient Location: PACU  Anesthesia Type:General  Level of Consciousness: drowsy and patient cooperative  Airway & Oxygen Therapy: Patient Spontanous Breathing and Patient connected to tracheostomy mask oxygen  Post-op Assessment: Report given to RN, Post -op Vital signs reviewed and stable and Patient moving all extremities  Post vital signs: Reviewed and stable  Last Vitals:  Vitals Value Taken Time  BP    Temp    Pulse 59 01/14/2018  9:53 AM  Resp    SpO2 80 % 01/14/2018  9:53 AM  Vitals shown include unvalidated device data.  Last Pain:  Vitals:   01/14/18 0700  PainSc: 0-No pain         Complications: No apparent anesthesia complications

## 2018-01-14 NOTE — Op Note (Signed)
Patient:  Angel Chambers  DOB:  30-Jan-1956  MRN:  [id number]   Preop Diagnosis: Thyroid carcinoma  Postop Diagnosis: Same  Procedure: Total thyroidectomy  Surgeon: Aviva Signs, MD  Anes: General endotracheal  Indications: Patient is a 62 year old white male who was found on ultrasound and fine-needle aspiration to have 2 malignant nodules in the left lobe of the thyroid consistent with papillary carcinoma.  No lymphadenopathy was seen.  The patient now presents for a total thyroidectomy.  The risks and benefits of the procedure including bleeding, infection, nerve injury, and the possibility of swallowing difficulties were fully explained to the patient, who gave informed consent.  Procedure note: The patient was placed in supine position.  After induction of general endotracheal anesthesia, the neck was prepped and draped using usual sterile technique with ChloraPrep.  Surgical site confirmation was performed.  A transverse incision was made above the jugular notch.  The platysma was divided using Bovie electrocautery.  The strap muscle was divided longitudinally in order to expose both lobes of the thyroid gland.  I turned my attention to the left lobe.  It was very enlarged and nodular.  The inferior thyroidal artery vein, middle thyroidal vein, and the suspensory ligament of Berry were all identified clipped, and divided using the harmonic scalpel.  Care was taken to avoid the parathyroid glands.  I was also able to avoid the left recurrent laryngeal nerve.  The dissection was taken across the trachea in the left lobe and isthmus were removed from the operative field and sent to pathology for further examination.  The right lobe was much smaller with just one area of nodularity superiorly.  The inferior thyroidal artery and vein, middle thyroidal vein, and the suspensory ligament of Berry were all ligated divided.  The right lobe was removed without difficulty.  It was sent to pathology for  further examination.  The central aspect of the neck was inspected and no lymphadenopathy was noted.  No abnormal bleeding was noted at the end of the procedure.  Arista and Surgicel were placed in both thyroid beds.  Pressure was held for a little bit and no bleeding was noted.  The strap muscle was reapproximated longitudinally using a 2-0 Vicryl running suture.  The platysma was reapproximated using a running 3-0 Vicryl suture.  Exparel was instilled into the surrounding wound.  The skin was closed using a 4-0 Monocryl subcuticular suture.  Dermabond was applied.  All tape and needle counts were correct at the end of the procedure.  Patient was extubated in the operating room and transferred to PACU in stable condition.  Patient was able to phonate the letter E without difficulty.    Complications: None  EBL: 10 cc  Specimen: Left lobe of thyroid gland and isthmus, right lobe of thyroid gland

## 2018-01-14 NOTE — Progress Notes (Signed)
Patient arrived to the floor. Alert and oriented. VSS.

## 2018-01-14 NOTE — Anesthesia Procedure Notes (Signed)
Procedure Name: Intubation Date/Time: 01/14/2018 7:41 AM Performed by: Charmaine Downs, CRNA Pre-anesthesia Checklist: Patient identified, Emergency Drugs available, Suction available and Patient being monitored Patient Re-evaluated:Patient Re-evaluated prior to induction Oxygen Delivery Method: Circle system utilized Preoxygenation: Pre-oxygenation with 100% oxygen Induction Type: IV induction, Cricoid Pressure applied and Rapid sequence Ventilation: Mask ventilation with difficulty, Two handed mask ventilation required and Oral airway inserted - appropriate to patient size Laryngoscope Size: Mac and 4 Grade View: Grade II Tube size: 8.0 mm Number of attempts: 2 Placement Confirmation: ETT inserted through vocal cords under direct vision,  positive ETCO2 and breath sounds checked- equal and bilateral Secured at: 22 cm Tube secured with: Tape Dental Injury: Teeth and Oropharynx as per pre-operative assessment  Difficulty Due To: Difficulty was anticipated Comments: Difficulty due to beard.

## 2018-01-14 NOTE — Anesthesia Postprocedure Evaluation (Signed)
Anesthesia Post Note  Patient: Angel Chambers  Procedure(s) Performed: TOTAL THYROIDECTOMY (N/A Neck)  Patient location during evaluation: PACU Anesthesia Type: General Level of consciousness: awake and patient cooperative Pain management: pain level controlled Vital Signs Assessment: post-procedure vital signs reviewed and stable Respiratory status: spontaneous breathing, nonlabored ventilation, respiratory function stable and patient connected to tracheostomy mask oxygen Cardiovascular status: blood pressure returned to baseline Postop Assessment: no apparent nausea or vomiting Anesthetic complications: no     Last Vitals:  Vitals:   01/14/18 0700  BP: (!) 142/93  Pulse: 64  Resp: 18  Temp: 36.8 C  SpO2: 98%    Last Pain:  Vitals:   01/14/18 0700  PainSc: 0-No pain                 Mackay Hanauer J

## 2018-01-14 NOTE — Interval H&P Note (Signed)
History and Physical Interval Note:  01/14/2018 7:24 AM  [name]  has presented today for surgery, with the diagnosis of thyroid cancer  The various methods of treatment have been discussed with the patient and family. After consideration of risks, benefits and other options for treatment, the patient has consented to  Procedure(s): TOTAL THYROIDECTOMY (N/A) as a surgical intervention .  The patient's history has been reviewed, patient examined, no change in status, stable for surgery.  I have reviewed the patient's chart and labs.  Questions were answered to the patient's satisfaction.     Aviva Signs

## 2018-01-15 ENCOUNTER — Encounter (HOSPITAL_COMMUNITY): Payer: Self-pay | Admitting: General Surgery

## 2018-01-15 DIAGNOSIS — C73 Malignant neoplasm of thyroid gland: Secondary | ICD-10-CM | POA: Diagnosis not present

## 2018-01-15 LAB — COMPREHENSIVE METABOLIC PANEL
ALBUMIN: 3.3 g/dL — AB (ref 3.5–5.0)
ALK PHOS: 58 U/L (ref 38–126)
ALT: 25 U/L (ref 0–44)
AST: 30 U/L (ref 15–41)
Anion gap: 5 (ref 5–15)
BUN: 12 mg/dL (ref 8–23)
CO2: 26 mmol/L (ref 22–32)
CREATININE: 0.86 mg/dL (ref 0.61–1.24)
Calcium: 8.8 mg/dL — ABNORMAL LOW (ref 8.9–10.3)
Chloride: 109 mmol/L (ref 98–111)
GFR calc Af Amer: >60 mL/min (ref >60)
GLUCOSE: 120 mg/dL — AB (ref 70–99)
Potassium: 3.8 mmol/L (ref 3.5–5.1)
Sodium: 140 mmol/L (ref 135–145)
TOTAL PROTEIN: 6 g/dL — AB (ref 6.5–8.1)
Total Bilirubin: 0.7 mg/dL (ref 0.3–1.2)

## 2018-01-15 LAB — CBC
HEMATOCRIT: 39.6 % (ref 39.0–52.0)
Hemoglobin: 13.3 g/dL (ref 13.0–17.0)
MCH: 31.9 pg (ref 26.0–34.0)
MCHC: 33.6 g/dL (ref 30.0–36.0)
MCV: 95 fL (ref 78.0–100.0)
PLATELETS: 153 10*3/uL (ref 150–400)
RBC: 4.17 MIL/uL — ABNORMAL LOW (ref 4.22–5.81)
RDW: 12.9 % (ref 11.5–15.5)
WBC: 7.7 10*3/uL (ref 4.0–10.5)

## 2018-01-15 MED ORDER — LEVOTHYROXINE SODIUM 100 MCG PO TABS: 100.0000 ug | ORAL_TABLET | Freq: Every day | ORAL | 2 refills | Status: DC

## 2018-01-15 MED ORDER — CALCIUM CARBONATE-VITAMIN D 500-200 MG-UNIT PO TABS: 2.0000 | ORAL_TABLET | Freq: Two times a day (BID) | ORAL | 1 refills | Status: DC

## 2018-01-15 MED ORDER — HYDROCODONE-ACETAMINOPHEN 5-325 MG PO TABS: 1.0000 | ORAL_TABLET | ORAL | 0 refills | Status: DC | PRN

## 2018-01-15 NOTE — Discharge Summary (Signed)
Physician Discharge Summary  Patient ID: Angel Chambers MRN: [id number] DOB/AGE: Jun 20, 1955 62 y.o.  Admit date: 01/14/2018 Discharge date: 01/15/2018  Admission Diagnoses: Neoplasm of thyroid  Discharge Diagnoses: Same Active Problems:   S/P total thyroidectomy   Discharged Condition: good  Hospital Course: Patient is a 62 year old white male with a known history of papillary thyroid carcinoma in the left lobe of the thyroid gland who underwent a total thyroidectomy on 01/14/2018.  Tolerated the surgery well.  His postoperative course has been unremarkable except for mild hypertension.  This was easily treated.  He did not develop significant hypocalcemia.  His voice is within normal limits.  He is being discharged home on 01/15/2018 in good and improving condition.  Treatments: surgery: Total thyroidectomy on 01/14/2018  Discharge Exam: Blood pressure 133/89, pulse 61, temperature 98.4 F (36.9 C), temperature source Oral, resp. rate 16, height 5\' 10"  (1.778 m), weight 198 lb 13.7 oz (90.2 kg), SpO2 99 %. General appearance: alert, cooperative and no distress Neck: Incision healing well Resp: clear to auscultation bilaterally Cardio: regular rate and rhythm, S1, S2 normal, no murmur, click, rub or gallop  Disposition: Discharge disposition: 01-Home or Self Care       Discharge Instructions    Diet - low sodium heart healthy   Complete by:  As directed    Increase activity slowly   Complete by:  As directed      Allergies as of 01/15/2018   No Known Allergies     Medication List    TAKE these medications   calcium-vitamin D 500-200 MG-UNIT tablet Commonly known as:  OSCAL WITH D Take 2 tablets by mouth 2 (two) times daily.   HYDROcodone-acetaminophen 5-325 MG tablet Commonly known as:  NORCO/VICODIN Take 1 tablet by mouth every 4 (four) hours as needed for moderate pain.   levothyroxine 100 MCG tablet Commonly known as:  SYNTHROID, LEVOTHROID Take 1 tablet (100 mcg  total) by mouth daily before breakfast.      Follow-up Information    Aviva Signs, MD. Schedule an appointment as soon as possible for a visit on 01/29/2018.   Specialty:  General Surgery Contact information: [id number] [address] [phone]           Signed: Aviva Signs 01/15/2018, 7:57 AM

## 2018-01-15 NOTE — Addendum Note (Signed)
Addendum  created 01/15/18 1045 by Charmaine Downs, CRNA   Sign clinical note

## 2018-01-15 NOTE — Anesthesia Postprocedure Evaluation (Signed)
Anesthesia Post Note  Patient: Angel Chambers  Procedure(s) Performed: TOTAL THYROIDECTOMY (N/A Neck)  Patient location during evaluation: Nursing Unit Anesthesia Type: General Level of consciousness: awake and alert and patient cooperative Pain management: pain level controlled Vital Signs Assessment: post-procedure vital signs reviewed and stable Respiratory status: spontaneous breathing, nonlabored ventilation and respiratory function stable Cardiovascular status: blood pressure returned to baseline Postop Assessment: no apparent nausea or vomiting Anesthetic complications: no     Last Vitals:  Vitals:   01/15/18 0313 01/15/18 0654  BP: 127/74 133/89  Pulse: 77 61  Resp: 16 16  Temp: 37.2 C 36.9 C  SpO2: 98% 99%    Last Pain:  Vitals:   01/15/18 0842  TempSrc:   PainSc: 0-No pain                 Ethin Drummond J

## 2018-01-15 NOTE — Progress Notes (Signed)
IV removed, 2x2 gauze and paper tape applied to site, patient tolerated well. Reviewed AVS with patient who verbalized understanding.  Patient awaiting wife's arrival to transport home.

## 2018-01-15 NOTE — Discharge Instructions (Signed)
Thyroidectomy, Care After °Refer to this sheet in the next few weeks. These instructions provide you with information about caring for yourself after your procedure. Your health care provider may also give you more specific instructions. Your treatment has been planned according to current medical practices, but problems sometimes occur. Call your health care provider if you have any problems or questions after your procedure. °What can I expect after the procedure? °After your procedure, it is typical to have: °· Mild pain in the neck or upper body, especially when swallowing. °· A sore throat. °· A weak voice. ° °Follow these instructions at home: °· Take medicines only as directed by your health care provider. °· If your entire thyroid gland was removed, you may need to take thyroid hormone medicine from now on. °· Do not take medicines that contain aspirin and ibuprofen until your health care provider says that you can. These medicines can increase your risk of bleeding. °· Some pain medicines cause constipation. Drink enough fluid to keep your urine clear or pale yellow. This can help to prevent constipation. °· Start slowly with eating. You may need to have only liquids and soft foods for a few days or as directed by your health care provider. °· Do not take baths, swim, or use a hot tub until your health care provider approves. °· There are many different ways to close and cover an incision, including stitches (sutures), skin glue, and adhesive strips. Follow your health care provider's instructions for: °? Incision care. °? Bandage (dressing) changes and removal. °? Incision closure removal. °· Resume your usual activities as directed by your health care provider. °· For the first 10 days after the procedure or as instructed by your health care provider: °? Do not lift anything heavier than 20 lb (9.1 kg). °? Do not jog, swim, or do other strenuous exercises. °? Do not play contact sports. °· Keep all  follow-up visits as directed by your health care provider. This is important. °Contact a health care provider if: °· The soreness in your throat gets worse. °· You have increased pain at your incision or incisions. °· You have increased bleeding from an incision. °· Your incision becomes infected. Watch for: °? Swelling. °? Redness. °? Warmth. °? Pus. °· You notice a bad smell coming from an incision or dressing. °· You have a fever. °· You feel lightheaded or faint. °· You have numbness, tingling, or muscle spasms in your: °? Arms. °? Hands. °? Feet. °? Face. °· You have trouble swallowing. °Get help right away if: °· You develop a rash. °· You have difficulty breathing. °· You hear whistling noises coming from your chest. °· You develop a cough that gets worse. °· Your speech changes, or you have hoarseness that gets worse. °This information is not intended to replace advice given to you by your health care provider. Make sure you discuss any questions you have with your health care provider. °Document Released: 12/23/2004 Document Revised: 02/06/2016 Document Reviewed: 11/05/2013 °Elsevier Interactive Patient Education © 2018 Elsevier Inc. ° °

## 2018-01-16 ENCOUNTER — Telehealth: Payer: Self-pay | Admitting: Family Medicine

## 2018-01-16 NOTE — Telephone Encounter (Signed)
Patient reports he had thyroidectomy on 01/14/18.  He has checked blood pressure today and it has been elevated.  172/124 about 7 am, 146/98 at 9 am, and 157/105 at 1 pm.  He contacted his surgeon who told him to recheck it in the morning and call him with the results.  The patient would like to let us know also and see if you recommend anything different than his surgeon.

## 2018-01-16 NOTE — Telephone Encounter (Signed)
Any headaches, chest pain, lightheadedness or shortness of breath?  Continue to follow blood pressures as he has been.  Follow-up with [place] for blood pressure check on Friday, can you schedule?  Any symptoms and he needs to be seen sooner.

## 2018-01-16 NOTE — Telephone Encounter (Signed)
Pt denies SOB, chest pain, headaches and lightheadedness. Appt scheduled for 01/18/18 at 10:30 with Dr Lajuana Ripple for BP check and pt advised to call back if he develops any of the above symptoms. Pt voiced understanding.

## 2018-01-16 NOTE — Progress Notes (Signed)
During postop call the patient said his blood pressure was elevated with pressures in the 170s. This nurse advised patient to call the doctor and see if he needed to move his appointment closer to time. Patient has no other complaints.

## 2018-01-18 ENCOUNTER — Encounter: Payer: Self-pay | Admitting: Family Medicine

## 2018-01-18 ENCOUNTER — Ambulatory Visit: Payer: BLUE CROSS/BLUE SHIELD | Admitting: Family Medicine

## 2018-01-18 VITALS — BP 162/107 | HR 72 | Temp 97.1°F | Ht 70.0 in | Wt 191.0 lb

## 2018-01-18 DIAGNOSIS — I10 Essential (primary) hypertension: Secondary | ICD-10-CM | POA: Diagnosis not present

## 2018-01-18 DIAGNOSIS — E890 Postprocedural hypothyroidism: Secondary | ICD-10-CM

## 2018-01-18 DIAGNOSIS — Z09 Encounter for follow-up examination after completed treatment for conditions other than malignant neoplasm: Secondary | ICD-10-CM

## 2018-01-18 LAB — CMP14+EGFR
A/G RATIO: 1.7 (ref 1.2–2.2)
ALK PHOS: 79 IU/L (ref 39–117)
ALT: 25 IU/L (ref 0–44)
AST: 18 IU/L (ref 0–40)
Albumin: 4.4 g/dL (ref 3.6–4.8)
BILIRUBIN TOTAL: 0.5 mg/dL (ref 0.0–1.2)
BUN/Creatinine Ratio: 16 (ref 10–24)
BUN: 14 mg/dL (ref 8–27)
CHLORIDE: 104 mmol/L (ref 96–106)
CO2: 23 mmol/L (ref 20–29)
Calcium: 9.2 mg/dL (ref 8.6–10.2)
Creatinine, Ser: 0.85 mg/dL (ref 0.76–1.27)
GFR calc non Af Amer: 94 mL/min/{1.73_m2} (ref >59)
GFR, EST AFRICAN AMERICAN: 109 mL/min/{1.73_m2} (ref >59)
GLUCOSE: 92 mg/dL (ref 65–99)
Globulin, Total: 2.6 g/dL (ref 1.5–4.5)
POTASSIUM: 4.2 mmol/L (ref 3.5–5.2)
Sodium: 141 mmol/L (ref 134–144)
TOTAL PROTEIN: 7 g/dL (ref 6.0–8.5)

## 2018-01-18 MED ORDER — AMLODIPINE BESYLATE 5 MG PO TABS: 5.0000 mg | ORAL_TABLET | Freq: Every day | ORAL | 3 refills | Status: DC

## 2018-01-18 NOTE — Progress Notes (Signed)
Subjective: CC: Hospital discharge follow up PCP: Angel Fraise, MD OBS:Angel Chambers is a 62 y.o. male presenting to clinic today for:  1. Hospital discharge f/u Patient status post total thyroidectomy for papillary thyroid carcinoma of the left lobe.  He was discharged from hospital on 01/15/2018.  Postoperative course was unremarkable except for mild hypertension.  He is here for blood pressure recheck.  No significant hypocalcemia noted.  No problems with change in voice.  He was sent home on Synthroid 100 mcg daily.  He has follow-up with general surgery on 01/29/2018. Patient denies chest pain, shortness of breath.  He felt like his head was a little swimmy after the surgery but he attributes this to the sedation medications for surgery.  He is ambulating independently.  Denies significant pain at surgical site.  No drainage.  No fevers, chills.  No difficulty swallowing.  ROS: Per HPI  No Known Allergies Past Medical History:  Diagnosis Date  . History of kidney stones   . Thyroid cancer ([place])     Current Outpatient Medications:  .  calcium-vitamin D (OSCAL WITH D) 500-200 MG-UNIT tablet, Take 2 tablets by mouth 2 (two) times daily., Disp: 60 tablet, Rfl: 1 .  HYDROcodone-acetaminophen (NORCO/VICODIN) 5-325 MG tablet, Take 1 tablet by mouth every 4 (four) hours as needed for moderate pain., Disp: 40 tablet, Rfl: 0 .  levothyroxine (SYNTHROID, LEVOTHROID) 100 MCG tablet, Take 1 tablet (100 mcg total) by mouth daily before breakfast., Disp: 60 tablet, Rfl: 2 Social History   Socioeconomic History  . Marital status: Married    Spouse name: Not on file  . Number of children: Not on file  . Years of education: Not on file  . Highest education level: Not on file  Occupational History  . Not on file  Social Needs  . Financial resource strain: Not on file  . Food insecurity:    Worry: Not on file    Inability: Not on file  . Transportation needs:    Medical: Not on file   Non-medical: Not on file  Tobacco Use  . Smoking status: Never Smoker  . Smokeless tobacco: Never Used  Substance and Sexual Activity  . Alcohol use: No  . Drug use: No  . Sexual activity: Yes  Lifestyle  . Physical activity:    Days per week: Not on file    Minutes per session: Not on file  . Stress: Not on file  Relationships  . Social connections:    Talks on phone: Not on file    Gets together: Not on file    Attends religious service: Not on file    Active member of club or organization: Not on file    Attends meetings of clubs or organizations: Not on file    Relationship status: Not on file  . Intimate partner violence:    Fear of current or ex partner: Not on file    Emotionally abused: Not on file    Physically abused: Not on file    Forced sexual activity: Not on file  Other Topics Concern  . Not on file  Social History Narrative  . Not on file   Family History  Problem Relation Age of Onset  . Diabetes Mother   . Kidney disease Mother   . Heart attack Father   . Cancer Sister        hysterectomy due to cancer  . Hypertension Brother   . Heart disease Brother   .  Thyroid disease Brother     Objective: Office vital signs reviewed. BP (!) 162/107   Pulse 72   Temp (!) 97.1 F (36.2 C) (Oral)   Ht _0  (1.778 m)   Wt 191 lb (86.6 kg)   BMI 27.41 kg/m   Physical Examination:  General: Awake, alert, well nourished, No acute distress HEENT: Normal    Neck: No masses palpated. No lymphadenopathy; horizontal surgical site noted at the base of the neck.  Glue in place.  No evidence of secondary infection.     Eyes: PERRLA, extraocular membranes intact, sclera white    Throat: moist mucus membranes Cardio: regular rate Pulm: normal work of breathing on room air Extremities: warm, well perfused, No edema, cyanosis or clubbing; +2 pulses bilaterally MSK: normal gait and station Neuro: stutters but no focal neurologic deficits noted  Assessment/  Plan: 62 y.o. male   1. Hospital discharge follow-up Doing well after thyroidectomy except for elevated blood pressures.  Will start Norvasc 5 mg daily.  Instructions for home blood pressure monitoring discussed.  I recommended he follow-up within the next 2 weeks for blood pressure recheck.  Thiazide was considered but given recent thyroidectomy and potential for low calcium, this was avoided.  Beta-blocker also considered but pulse is borderline and I did not want to further decrease this.  2. Essential hypertension Home care instructions were reviewed.  Follow-up with PCP as directed - CMP14+EGFR  3. S/P total thyroidectomy Has follow-up with general surgery in 2 weeks. - CMP14+EGFR  Meds ordered this encounter  Medications  . amLODipine (NORVASC) 5 MG tablet    Sig: Take 1 tablet (5 mg total) by mouth daily.    Dispense:  90 tablet    Refill:  [place], [place] [phone]

## 2018-01-18 NOTE — Patient Instructions (Signed)
Your goal blood pressure is less than 140/90.  If you have blood pressures below 110/60, please contact the office.   I have prescribed you amlodipine 5 mg to start today.  Monitor your blood pressures once a day around the same time every day at least 1 hour after taking your blood pressure medicine.  You had labs performed today.  You will be contacted with the results of the labs once they are available, usually in the next 3 business days for routine lab work.    How to Take Your Blood Pressure You can take your blood pressure at home with a machine. You may need to check your blood pressure at home:  To check if you have high blood pressure (hypertension).  To check your blood pressure over time.  To make sure your blood pressure medicine is working.  Supplies needed: You will need a blood pressure machine, or monitor. You can buy one at a drugstore or online. When choosing one:  Choose one with an arm cuff.  Choose one that wraps around your upper arm. Only one finger should fit between your arm and the cuff.  Do not choose one that measures your blood pressure from your wrist or finger.  Your doctor can suggest a monitor. How to prepare Avoid these things for 30 minutes before checking your blood pressure:  Drinking caffeine.  Drinking alcohol.  Eating.  Smoking.  Exercising.  Five minutes before checking your blood pressure:  Pee.  Sit in a dining chair. Avoid sitting in a soft couch or armchair.  Be quiet. Do not talk.  How to take your blood pressure Follow the instructions that came with your machine. If you have a digital blood pressure monitor, these may be the instructions: 1. Sit up straight. 2. Place your feet on the floor. Do not cross your ankles or legs. 3. Rest your left arm at the level of your heart. You may rest it on a table, desk, or chair. 4. Pull up your shirt sleeve. 5. Wrap the blood pressure cuff around the upper part of your left  arm. The cuff should be 1 inch (2.5 cm) above your elbow. It is best to wrap the cuff around bare skin. 6. Fit the cuff snugly around your arm. You should be able to place only one finger between the cuff and your arm. 7. Put the cord inside the groove of your elbow. 8. Press the power button. 9. Sit quietly while the cuff fills with air and loses air. 10. Write down the numbers on the screen. 11. Wait 2-3 minutes and then repeat steps 1-10.  What do the numbers mean? Two numbers make up your blood pressure. The first number is called systolic pressure. The second is called diastolic pressure. An example of a blood pressure reading is "120 over 80" (or 120/80). If you are an adult and do not have a medical condition, use this guide to find out if your blood pressure is normal: Normal  First number: below 120.  Second number: below 80. Elevated  First number: 120-129.  Second number: below 80. Hypertension stage 1  First number: 130-139.  Second number: 80-89. Hypertension stage 2  First number: 140 or above.  Second number: 23 or above. Your blood pressure is above normal even if only the top or bottom number is above normal. Follow these instructions at home:  Check your blood pressure as often as your doctor tells you to.  Take your monitor  to your next doctor's appointment. Your doctor will: ? Make sure you are using it correctly. ? Make sure it is working right.  Make sure you understand what your blood pressure numbers should be.  Tell your doctor if your medicines are causing side effects. Contact a doctor if:  Your blood pressure keeps being high. Get help right away if:  Your first blood pressure number is higher than 180.  Your second blood pressure number is higher than 120. This information is not intended to replace advice given to you by your health care provider. Make sure you discuss any questions you have with your health care provider. Document  Released: 05/18/2008 Document Revised: 05/03/2016 Document Reviewed: 11/12/2015 Elsevier Interactive Patient Education  [employer].

## 2018-01-29 ENCOUNTER — Ambulatory Visit (INDEPENDENT_AMBULATORY_CARE_PROVIDER_SITE_OTHER): Payer: Self-pay | Admitting: General Surgery

## 2018-01-29 ENCOUNTER — Encounter: Payer: Self-pay | Admitting: General Surgery

## 2018-01-29 VITALS — BP 144/88 | HR 65 | Temp 97.5°F | Resp 18 | Wt 194.0 lb

## 2018-01-29 DIAGNOSIS — Z09 Encounter for follow-up examination after completed treatment for conditions other than malignant neoplasm: Secondary | ICD-10-CM

## 2018-01-29 NOTE — Progress Notes (Signed)
Subjective:     Angel Chambers  Status post total thyroidectomy.  Patient doing well.  Has had no voice changes.  He denies any extremity twitching.  He is taking his calcium and Synthroid as prescribed.  Patient denies any incisional pain. Objective:    BP (!) 144/88 (BP Location: Left Arm, Patient Position: Sitting, Cuff Size: Normal)   Pulse 65   Temp (!) 97.5 F (36.4 C) (Temporal)   Resp 18   Wt 194 lb (88 kg)   BMI 27.84 kg/m   General:  alert, cooperative and no distress  Neck incision healing well. Final pathology shows thyroid cancer.  Patient aware.     Assessment:    Doing well postoperatively.    Plan:   Patient already has follow-up scheduled with oncology.  Follow-up here as needed.

## 2018-02-12 ENCOUNTER — Ambulatory Visit (HOSPITAL_COMMUNITY): Payer: BLUE CROSS/BLUE SHIELD | Admitting: Hematology

## 2018-02-12 ENCOUNTER — Encounter (HOSPITAL_COMMUNITY): Payer: Self-pay | Admitting: Hematology

## 2018-02-12 ENCOUNTER — Other Ambulatory Visit: Payer: Self-pay

## 2018-02-12 ENCOUNTER — Inpatient Hospital Stay (HOSPITAL_COMMUNITY): Payer: BLUE CROSS/BLUE SHIELD | Attending: Hematology | Admitting: Hematology

## 2018-02-12 DIAGNOSIS — C73 Malignant neoplasm of thyroid gland: Secondary | ICD-10-CM | POA: Insufficient documentation

## 2018-02-12 DIAGNOSIS — Z87442 Personal history of urinary calculi: Secondary | ICD-10-CM | POA: Diagnosis not present

## 2018-02-12 NOTE — Assessment & Plan Note (Signed)
1.  Papillary thyroid carcinoma: - Was found to have thyroid nodule on physical examination, ultrasound of the thyroid on 10/29/2017 showed left superior nodule 1.5 cm, solid and a second nodule in the left mid thyroid, measuring 4.1 cm, mixed cystic and solid with no evidence of adenopathy. - Fine-needle aspiration of thyroid nodule in the mid left lobe on 11/08/2017 consistent with papillary carcinoma, BRAF V600 E mutation positive. - He underwent total thyroidectomy on 01/14/2018.  I discussed the pathology report with the patient which showed 4.3 cm papillary thyroid carcinoma, negative margins.  PT3a pNX. - As his tumor is more than 4 cm, RAI ablation is recommended.  I will make a referral to [facility]. -I will see him back in 3 months for follow-up.  We will plan to repeat TSH, thyroglobulin and antithyroglobulin antibodies.

## 2018-02-12 NOTE — Progress Notes (Signed)
Angel Chambers, [place] [address]   CLINIC:  Medical Oncology/Hematology  PCP:  Claretta Fraise, MD [place] [address] [phone]   REASON FOR VISIT:  Follow-up for papillary thyroid carcinoma.  CURRENT THERAPY: Referral for RAI ablation.  INTERVAL HISTORY:  Angel Chambers 62 y.o. male returns for follow-up after thyroid surgery.  he had total thyroidectomy done on 01/14/2018.  He is recovered quite well.  He works as a Administrator.  He was also started on Synthroid 100 mcg on 01/15/2018.  Denies any difficulty swallowing or voice changes.  Denies any fevers, night sweats or weight loss.  Denies any excessive fatigue.  Energy levels and appetite are described as 100%.    REVIEW OF SYSTEMS:  Review of Systems  Constitutional: Negative.   Cardiovascular: Negative.   All other systems reviewed and are negative.    PAST MEDICAL/SURGICAL HISTORY:  Past Medical History:  Diagnosis Date  . History of kidney stones   . Thyroid cancer [facility])    Past Surgical History:  Procedure Laterality Date  . THYROIDECTOMY N/A 01/14/2018   Procedure: TOTAL THYROIDECTOMY;  Surgeon: Aviva Signs, MD;  Location: [facility];  Service: General;  Laterality: N/A;  . WRIST FRACTURE SURGERY Left 2004     SOCIAL HISTORY:  Social History   Socioeconomic History  . Marital status: Married    Spouse name: Not on file  . Number of children: Not on file  . Years of education: Not on file  . Highest education level: Not on file  Occupational History  . Not on file  Social Needs  . Financial resource strain: Not on file  . Food insecurity:    Worry: Not on file    Inability: Not on file  . Transportation needs:    Medical: Not on file    Non-medical: Not on file  Tobacco Use  . Smoking status: Never Smoker  . Smokeless tobacco: Never Used  Substance and Sexual Activity  . Alcohol use: No  . Drug use: No  . Sexual activity: Yes  Lifestyle  .  Physical activity:    Days per week: Not on file    Minutes per session: Not on file  . Stress: Not on file  Relationships  . Social connections:    Talks on phone: Not on file    Gets together: Not on file    Attends religious service: Not on file    Active member of club or organization: Not on file    Attends meetings of clubs or organizations: Not on file    Relationship status: Not on file  . Intimate partner violence:    Fear of current or ex partner: Not on file    Emotionally abused: Not on file    Physically abused: Not on file    Forced sexual activity: Not on file  Other Topics Concern  . Not on file  Social History Narrative  . Not on file    FAMILY HISTORY:  Family History  Problem Relation Age of Onset  . Diabetes Mother   . Kidney disease Mother   . Heart attack Father   . Cancer Sister        hysterectomy due to cancer  . Hypertension Brother   . Heart disease Brother   . Thyroid disease Brother     CURRENT MEDICATIONS:  Outpatient Encounter Medications as of 02/12/2018  Medication Sig  . calcium-vitamin D (OSCAL WITH  D) 500-200 MG-UNIT tablet Take 2 tablets by mouth 2 (two) times daily.  Marland Kitchen levothyroxine (SYNTHROID, LEVOTHROID) 100 MCG tablet Take 1 tablet (100 mcg total) by mouth daily before breakfast.   No facility-administered encounter medications on file as of 02/12/2018.     ALLERGIES:  No Known Allergies   PHYSICAL EXAM:  ECOG Performance status: 0  Vitals:   02/12/18 1508  BP: 121/77  Pulse: 62  Resp: 16  Temp: 98.2 F (36.8 C)  SpO2: 100%   Filed Weights   02/12/18 1508  Weight: 195 lb 3.2 oz (88.5 kg)    Physical Exam H ENT: Transverse scar at the central base of the neck is well-healed.  No palpable masses.  No palpable adenopathy in the neck. Chest: Bilateral clear to auscultation. CVS: S1-S2 regular rate and rhythm. Extremities: No edema or cyanosis. Abdomen: Soft nontender with no palpable organomegaly.  LABORATORY  DATA:  I have reviewed the labs as listed.  CBC    Component Value Date/Time   WBC 7.7 01/15/2018 0523   RBC 4.17 (L) 01/15/2018 0523   HGB 13.3 01/15/2018 0523   HGB 15.1 10/30/2017 0945   HCT 39.6 01/15/2018 0523   HCT 43.8 10/30/2017 0945   PLT 153 01/15/2018 0523   PLT 175 10/30/2017 0945   MCV 95.0 01/15/2018 0523   MCV 92 10/30/2017 0945   MCH 31.9 01/15/2018 0523   MCHC 33.6 01/15/2018 0523   RDW 12.9 01/15/2018 0523   RDW 13.6 10/30/2017 0945   LYMPHSABS 1.5 01/09/2018 0901   LYMPHSABS 1.3 10/30/2017 0945   MONOABS 0.3 01/09/2018 0901   EOSABS 0.1 01/09/2018 0901   EOSABS 0.1 10/30/2017 0945   BASOSABS 0.0 01/09/2018 0901   BASOSABS 0.0 10/30/2017 0945   CMP Latest Ref Rng & Units 01/18/2018 01/15/2018 01/14/2018  Glucose 65 - 99 mg/dL 92 120(H) 103(H)  BUN 8 - 27 mg/dL 14 12 15   Creatinine 0.76 - 1.27 mg/dL 0.85 0.86 1.04  Sodium 134 - 144 mmol/L 141 140 140  Potassium 3.5 - 5.2 mmol/L 4.2 3.8 3.9  Chloride 96 - 106 mmol/L 104 109 109  CO2 20 - 29 mmol/L 23 26 25   Calcium 8.6 - 10.2 mg/dL 9.2 8.8(L) 8.6(L)  Total Protein 6.0 - 8.5 g/dL 7.0 6.0(L) 6.5  Total Bilirubin 0.0 - 1.2 mg/dL 0.5 0.7 0.5  Alkaline Phos 39 - 117 IU/L 79 58 66  AST 0 - 40 IU/L 18 30 19   ALT 0 - 44 IU/L 25 25 17         ASSESSMENT & PLAN:   Thyroid cancer ([place]) 1.  Papillary thyroid carcinoma: - Was found to have thyroid nodule on physical examination, ultrasound of the thyroid on 10/29/2017 showed left superior nodule 1.5 cm, solid and a second nodule in the left mid thyroid, measuring 4.1 cm, mixed cystic and solid with no evidence of adenopathy. - Fine-needle aspiration of thyroid nodule in the mid left lobe on 11/08/2017 consistent with papillary carcinoma, BRAF V600 E mutation positive. - He underwent total thyroidectomy on 01/14/2018.  I discussed the pathology report with the patient which showed 4.3 cm papillary thyroid carcinoma, negative margins.  PT3a pNX. - As his tumor is more  than 4 cm, RAI ablation is recommended.  I will make a referral to [facility]. -I will see him back in 3 months for follow-up.  We will plan to repeat TSH, thyroglobulin and antithyroglobulin antibodies.      Orders placed this encounter:  No orders of the defined types were placed in this encounter.     Derek Jack, MD [place] [phone]

## 2018-02-14 ENCOUNTER — Encounter (HOSPITAL_COMMUNITY): Payer: Self-pay | Admitting: Lab

## 2018-02-14 NOTE — Progress Notes (Unsigned)
Referral sent to Dr Iran Planas at [facility] spoke with office Phineas Real  )and they will contact pt.  Office [phone]

## 2018-02-15 ENCOUNTER — Other Ambulatory Visit (HOSPITAL_COMMUNITY): Payer: Self-pay | Admitting: *Deleted

## 2018-02-15 DIAGNOSIS — C73 Malignant neoplasm of thyroid gland: Secondary | ICD-10-CM

## 2018-04-03 DIAGNOSIS — E890 Postprocedural hypothyroidism: Secondary | ICD-10-CM | POA: Diagnosis not present

## 2018-04-03 DIAGNOSIS — C73 Malignant neoplasm of thyroid gland: Secondary | ICD-10-CM | POA: Diagnosis not present

## 2018-04-04 ENCOUNTER — Other Ambulatory Visit (HOSPITAL_COMMUNITY): Payer: Self-pay | Admitting: Endocrinology

## 2018-04-10 ENCOUNTER — Other Ambulatory Visit (HOSPITAL_COMMUNITY): Payer: Self-pay | Admitting: Endocrinology

## 2018-04-10 DIAGNOSIS — C73 Malignant neoplasm of thyroid gland: Secondary | ICD-10-CM

## 2018-04-18 ENCOUNTER — Other Ambulatory Visit (HOSPITAL_COMMUNITY): Payer: BLUE CROSS/BLUE SHIELD

## 2018-04-21 DIAGNOSIS — S30860A Insect bite (nonvenomous) of lower back and pelvis, initial encounter: Secondary | ICD-10-CM | POA: Diagnosis not present

## 2018-04-21 DIAGNOSIS — W57XXXA Bitten or stung by nonvenomous insect and other nonvenomous arthropods, initial encounter: Secondary | ICD-10-CM | POA: Diagnosis not present

## 2018-04-21 DIAGNOSIS — Z6826 Body mass index (BMI) 26.0-26.9, adult: Secondary | ICD-10-CM | POA: Diagnosis not present

## 2018-04-23 ENCOUNTER — Ambulatory Visit (HOSPITAL_COMMUNITY): Payer: BLUE CROSS/BLUE SHIELD | Admitting: Hematology

## 2018-04-29 ENCOUNTER — Encounter (HOSPITAL_COMMUNITY)
Admission: RE | Admit: 2018-04-29 | Discharge: 2018-04-29 | Disposition: A | Discharge status: Home / Self Care | Payer: BLUE CROSS/BLUE SHIELD | Source: Ambulatory Visit | Attending: Endocrinology | Admitting: Endocrinology

## 2018-04-29 ENCOUNTER — Other Ambulatory Visit (HOSPITAL_COMMUNITY): Payer: Self-pay | Admitting: Endocrinology

## 2018-04-29 DIAGNOSIS — C73 Malignant neoplasm of thyroid gland: Secondary | ICD-10-CM | POA: Insufficient documentation

## 2018-04-29 MED ORDER — THYROTROPIN ALFA 1.1 MG IM SOLR
0.9000 mg | INTRAMUSCULAR | Status: AC
  Administered 2018-04-29: 0.9 mg via INTRAMUSCULAR

## 2018-04-29 MED ORDER — THYROTROPIN ALFA 1.1 MG IM SOLR
INTRAMUSCULAR | Status: AC
  Filled 2018-04-29: qty 0.9

## 2018-04-30 ENCOUNTER — Encounter (HOSPITAL_COMMUNITY)
Admission: RE | Admit: 2018-04-30 | Discharge: 2018-04-30 | Disposition: A | Discharge status: Home / Self Care | Payer: BLUE CROSS/BLUE SHIELD | Source: Ambulatory Visit | Attending: Endocrinology | Admitting: Endocrinology

## 2018-04-30 DIAGNOSIS — C73 Malignant neoplasm of thyroid gland: Secondary | ICD-10-CM | POA: Diagnosis not present

## 2018-04-30 MED ORDER — THYROTROPIN ALFA 1.1 MG IM SOLR
INTRAMUSCULAR | Status: AC
  Filled 2018-04-30: qty 0.9

## 2018-04-30 MED ORDER — THYROTROPIN ALFA 1.1 MG IM SOLR
0.9000 mg | INTRAMUSCULAR | Status: AC
  Administered 2018-04-30: 0.9 mg via INTRAMUSCULAR

## 2018-05-01 ENCOUNTER — Encounter (HOSPITAL_COMMUNITY)
Admission: RE | Admit: 2018-05-01 | Discharge: 2018-05-01 | Disposition: A | Discharge status: Home / Self Care | Payer: BLUE CROSS/BLUE SHIELD | Source: Ambulatory Visit | Attending: Endocrinology | Admitting: Endocrinology

## 2018-05-01 DIAGNOSIS — C73 Malignant neoplasm of thyroid gland: Secondary | ICD-10-CM | POA: Diagnosis not present

## 2018-05-01 MED ORDER — SODIUM IODIDE I 131 CAPSULE
98.0000 | Freq: Once | INTRAVENOUS | Status: AC | PRN
  Administered 2018-05-01: 98 via ORAL

## 2018-05-09 ENCOUNTER — Encounter (HOSPITAL_COMMUNITY)
Admission: RE | Admit: 2018-05-09 | Discharge: 2018-05-09 | Disposition: A | Discharge status: Home / Self Care | Payer: BLUE CROSS/BLUE SHIELD | Source: Ambulatory Visit | Attending: Endocrinology | Admitting: Endocrinology

## 2018-05-09 DIAGNOSIS — C73 Malignant neoplasm of thyroid gland: Secondary | ICD-10-CM

## 2018-05-13 ENCOUNTER — Other Ambulatory Visit: Payer: Self-pay | Admitting: Family Medicine

## 2018-05-13 ENCOUNTER — Telehealth: Payer: Self-pay | Admitting: Family Medicine

## 2018-05-13 DIAGNOSIS — Z1211 Encounter for screening for malignant neoplasm of colon: Secondary | ICD-10-CM

## 2018-05-20 ENCOUNTER — Inpatient Hospital Stay (HOSPITAL_COMMUNITY): Payer: BLUE CROSS/BLUE SHIELD | Attending: Nurse Practitioner

## 2018-05-21 ENCOUNTER — Other Ambulatory Visit (HOSPITAL_COMMUNITY): Payer: Self-pay | Admitting: *Deleted

## 2018-05-21 DIAGNOSIS — C73 Malignant neoplasm of thyroid gland: Secondary | ICD-10-CM

## 2018-05-22 ENCOUNTER — Other Ambulatory Visit (HOSPITAL_COMMUNITY): Payer: BLUE CROSS/BLUE SHIELD

## 2018-05-23 ENCOUNTER — Ambulatory Visit (HOSPITAL_COMMUNITY): Payer: BLUE CROSS/BLUE SHIELD | Admitting: Hematology

## 2018-05-30 ENCOUNTER — Ambulatory Visit: Payer: BLUE CROSS/BLUE SHIELD

## 2018-05-31 ENCOUNTER — Ambulatory Visit (INDEPENDENT_AMBULATORY_CARE_PROVIDER_SITE_OTHER): Payer: BLUE CROSS/BLUE SHIELD | Admitting: Family

## 2018-05-31 ENCOUNTER — Ambulatory Visit (INDEPENDENT_AMBULATORY_CARE_PROVIDER_SITE_OTHER): Payer: BLUE CROSS/BLUE SHIELD

## 2018-05-31 ENCOUNTER — Encounter: Payer: Self-pay | Admitting: Family

## 2018-05-31 VITALS — BP 133/88 | HR 64 | Temp 96.7°F | Ht 70.0 in | Wt 198.0 lb

## 2018-05-31 DIAGNOSIS — M1712 Unilateral primary osteoarthritis, left knee: Secondary | ICD-10-CM | POA: Diagnosis not present

## 2018-05-31 DIAGNOSIS — M25562 Pain in left knee: Secondary | ICD-10-CM

## 2018-05-31 MED ORDER — DICLOFENAC SODIUM 75 MG PO TBEC: 75.0000 mg | DELAYED_RELEASE_TABLET | Freq: Two times a day (BID) | ORAL | 0 refills | Status: DC

## 2018-05-31 NOTE — Progress Notes (Signed)
Subjective:    Patient ID: Angel Chambers, male    DOB: May 14, 1956, 62 y.o.   MRN: [id number]  Chief Complaint  Patient presents with  . knot on left knee    Knee Pain   The incident occurred more than 1 week ago. There was no injury mechanism. The pain is present in the left knee. The quality of the pain is described as aching. The pain is at a severity of 6/10. The pain is mild. The pain has been intermittent (worse at night) since onset. Pertinent negatives include no loss of motion, muscle weakness, numbness or tingling. He reports no foreign bodies present. Nothing aggravates the symptoms. He has tried nothing for the symptoms. The treatment provided no relief.      Review of Systems  Musculoskeletal:       Left knee pain   Neurological: Negative for tingling and numbness.  All other systems reviewed and are negative.      Objective:   Physical Exam Vitals signs reviewed.  Constitutional:      General: He is not in acute distress.    Appearance: He is well-developed.  HENT:     Head: Normocephalic.     Right Ear: External ear normal.     Left Ear: External ear normal.  Eyes:     General:        Right eye: No discharge.        Left eye: No discharge.     Pupils: Pupils are equal, round, and reactive to light.  Neck:     Musculoskeletal: Normal range of motion and neck supple.     Thyroid: No thyromegaly.  Cardiovascular:     Rate and Rhythm: Normal rate and regular rhythm.     Heart sounds: Normal heart sounds. No murmur.  Pulmonary:     Effort: Pulmonary effort is normal. No respiratory distress.     Breath sounds: Normal breath sounds. No wheezing.  Abdominal:     General: Bowel sounds are normal. There is no distension.     Palpations: Abdomen is soft.     Tenderness: There is no abdominal tenderness.  Musculoskeletal: Normal range of motion.        General: No tenderness.     Comments: Mild pain in left knee with flexion  Skin:    General: Skin is warm  and dry.     Findings: No erythema or rash.  Neurological:     Mental Status: He is alert and oriented to person, place, and time.     Cranial Nerves: No cranial nerve deficit.     Deep Tendon Reflexes: Reflexes are normal and symmetric.  Psychiatric:        Behavior: Behavior normal.        Thought Content: Thought content normal.        Judgment: Judgment normal.    X-ray left knee- Negative for acute, Preliminary reading by Evelina Dun, FNP [facility].    BP (!) 147/91   Pulse 63   Temp (!) 96.7 F (35.9 C) (Oral)   Ht 5\' 10"  (1.778 m)   Wt 198 lb (89.8 kg)   BMI 28.41 kg/m      Assessment & Plan:  Angel Chambers comes in today with chief complaint of knot on left knee   Diagnosis and orders addressed:  1. Acute pain of left knee Rest Ice ROM exercises encouraged No others NSAID's while taking diclofenac  RTO to office if pain worsens  or does not improve - DG Knee 1-2 Views Left; Future - diclofenac (VOLTAREN) 75 MG EC tablet; Take 1 tablet (75 mg total) by mouth 2 (two) times daily.  Dispense: 30 tablet; Refill: 0   Evelina Dun, FNP

## 2018-05-31 NOTE — Patient Instructions (Signed)

## 2018-06-04 ENCOUNTER — Ambulatory Visit: Payer: BLUE CROSS/BLUE SHIELD

## 2018-06-04 DIAGNOSIS — C73 Malignant neoplasm of thyroid gland: Secondary | ICD-10-CM | POA: Diagnosis not present

## 2018-06-04 DIAGNOSIS — E890 Postprocedural hypothyroidism: Secondary | ICD-10-CM | POA: Diagnosis not present

## 2018-06-05 ENCOUNTER — Ambulatory Visit (INDEPENDENT_AMBULATORY_CARE_PROVIDER_SITE_OTHER): Payer: Self-pay

## 2018-06-05 DIAGNOSIS — Z1211 Encounter for screening for malignant neoplasm of colon: Secondary | ICD-10-CM

## 2018-06-05 MED ORDER — PEG 3350-KCL-NA BICARB-NACL 420 G PO SOLR: 4000.0000 mL | ORAL | 0 refills | Status: DC

## 2018-06-05 NOTE — Progress Notes (Signed)
Gastroenterology Pre-Procedure Review  Request Date:06/05/18 Requesting Physician: Dr.Stacks- previous tcs 10+ years ago, he is not sure MD name but reports he did not have any polyps.   PATIENT REVIEW QUESTIONS: The patient responded to the following health history questions as indicated:    1. Diabetes Melitis: no 2. Joint replacements in the past 12 months: no 3. Major health problems in the past 3 months: yes (had thyroid surgery in July with Dr.Jenkins- cancer cells found- had 2 weeks of chemo med in November, pt stated recent pet scan normal except a few cells in his thyroid) 4. Has an artificial valve or MVP: no 5. Has a defibrillator: no 6. Has been advised in past to take antibiotics in advance of a procedure like teeth cleaning: no 7. Family history of colon cancer: no  8. Alcohol Use: no 9. History of sleep apnea: no  10. History of coronary artery or other vascular stents placed within the last 12 months: no 11. History of any prior anesthesia complications: no    MEDICATIONS & ALLERGIES:    Patient reports the following regarding taking any blood thinners:   Plavix? no Aspirin? no Coumadin? no Brilinta? no Xarelto? no Eliquis? no Pradaxa? no Savaysa? no Effient? no  Patient confirms/reports the following medications:  Current Outpatient Medications  Medication Sig Dispense Refill  . calcium-vitamin D (OSCAL WITH D) 500-200 MG-UNIT tablet Take 2 tablets by mouth 2 (two) times daily. 60 tablet 1  . levothyroxine (SYNTHROID, LEVOTHROID) 100 MCG tablet Take 1 tablet (100 mcg total) by mouth daily before breakfast. 60 tablet 2   No current facility-administered medications for this visit.     Patient confirms/reports the following allergies:  No Known Allergies  No orders of the defined types were placed in this encounter.   AUTHORIZATION INFORMATION Primary Insurance: [place],  [place] #: [id number] Pre-Cert / Josem Kaufmann required: no   SCHEDULE  INFORMATION: Procedure has been scheduled as follows:  Date: 06/18/18, Time: 10:30 Location: [facility] Dr.Rourk  This Gastroenterology Pre-Precedure Review Form is being routed to the following provider(s): Neil Crouch, PA

## 2018-06-05 NOTE — Patient Instructions (Signed)
Angel Chambers   Mar 15, 1956 MRN: [id number]    Procedure Date: 06/18/18 Time to register: 9:30am Place to register: [facility] Procedure Time: 10:30am Scheduled provider: R. Garfield Cornea, MD  PREPARATION FOR COLONOSCOPY WITH TRI-LYTE SPLIT PREP  Please notify us immediately if you are diabetic, take iron supplements, or if you are on Coumadin or any other blood thinners.   You will need to purchase 1 fleet enema and 1 box of Bisacodyl 63m tablets.   2 DAYS BEFORE PROCEDURE:  DATE: 06/16/18   DAY: Sunday Begin clear liquid diet AFTER your lunch meal. NO SOLID FOODS after this point.  1 DAY BEFORE PROCEDURE:  DATE: 06/17/18  DAY: Monday Continue clear liquids the entire day - NO SOLID FOOD.   At 2:00 pm:  Take 2 Bisacodyl tablets.   At 4:00pm:  Start drinking your solution. Make sure you mix well per instructions on the bottle. Try to drink 1 (one) 8 ounce glass every 10-15 minutes until you have consumed HALF the jug. You should complete by 6:00pm.You must keep the left over solution refrigerated until completed next day.  Continue clear liquids. You must drink plenty of clear liquids to prevent dehyration and kidney failure.     DAY OF PROCEDURE:   DATE: 06/18/18   DAY: Tuesday If you take medications for your heart, blood pressure or breathing, you may take these medications.   Five hours before your procedure time @ 5:30am:  Finish remaining amout of bowel prep, drinking 1 (one) 8 ounce glass every 10-15 minutes until complete. You have two hours to consume remaining prep.   Three hours before your procedure time _0 :30am:  Nothing by mouth.   At least one hour before going to the hospital:  Give yourself one Fleet enema. You may take your morning medications with sip of water unless we have instructed otherwise.      Please see below for Dietary Information.  CLEAR LIQUIDS INCLUDE:  Water Jello (NOT red in color)   Ice Popsicles (NOT red in color)   Tea  (sugar ok, no milk/cream) Powdered fruit flavored drinks  Coffee (sugar ok, no milk/cream) Gatorade/ Lemonade/ Kool-Aid  (NOT red in color)   Juice: apple, white grape, white cranberry Soft drinks  Clear bullion, consomme, broth (fat free beef/chicken/vegetable)  Carbonated beverages (any kind)  Strained chicken noodle soup Hard Candy   Remember: Clear liquids are liquids that will allow you to see your fingers on the other side of a clear glass. Be sure liquids are NOT red in color, and not cloudy, but CLEAR.  DO NOT EAT OR DRINK ANY OF THE FOLLOWING:  Dairy products of any kind   Cranberry juice Tomato juice / V8 juice   Grapefruit juice Orange juice     Red grape juice  Do not eat any solid foods, including such foods as: cereal, oatmeal, yogurt, fruits, vegetables, creamed soups, eggs, bread, crackers, pureed foods in a blender, etc.   HELPFUL HINTS FOR DRINKING PREP SOLUTION:   Make sure prep is extremely cold. Mix and refrigerate the the morning of the prep. You may also put in the freezer.   You may try mixing some Crystal Light or Country Time Lemonade if you prefer. Mix in small amounts; add more if necessary.  Try drinking through a straw  Rinse mouth with water or a mouthwash between glasses, to remove after-taste.  Try sipping on a cold beverage /ice/ popsicles between glasses of prep.  Place a  piece of sugar-free hard candy in mouth between glasses.  If you become nauseated, try consuming smaller amounts, or stretch out the time between glasses. Stop for 30-60 minutes, then slowly start back drinking.        OTHER INSTRUCTIONS  You will need a responsible adult at least 61 years of age to accompany you and drive you home. This person must remain in the waiting room during your procedure. The hospital will cancel your procedure if you do not have a responsible adult with you.   1. Wear loose fitting clothing that is easily removed. 2. Leave jewelry and other  valuables at home.  3. Remove all body piercing jewelry and leave at home. 4. Total time from sign-in until discharge is approximately 2-3 hours. 5. You should go home directly after your procedure and rest. You can resume normal activities the day after your procedure. 6. The day of your procedure you should not:  Drive  Make legal decisions  Operate machinery  Drink alcohol  Return to work   You may call the office (Dept: [phone]) before 5:00pm, or page the doctor on call [phone]) after 5:00pm, for further instructions, if necessary.   Insurance Information YOU WILL NEED TO CHECK WITH YOUR INSURANCE COMPANY FOR THE BENEFITS OF COVERAGE YOU HAVE FOR THIS PROCEDURE.  UNFORTUNATELY, NOT ALL INSURANCE COMPANIES HAVE BENEFITS TO COVER ALL OR PART OF THESE TYPES OF PROCEDURES.  IT IS YOUR RESPONSIBILITY TO CHECK YOUR BENEFITS, HOWEVER, WE WILL BE GLAD TO ASSIST YOU WITH ANY CODES YOUR INSURANCE COMPANY MAY NEED.    PLEASE NOTE THAT MOST INSURANCE COMPANIES WILL NOT COVER A SCREENING COLONOSCOPY FOR PEOPLE UNDER THE AGE OF 50  IF YOU HAVE BCBS INSURANCE, YOU MAY HAVE BENEFITS FOR A SCREENING COLONOSCOPY BUT IF POLYPS ARE FOUND THE DIAGNOSIS WILL CHANGE AND THEN YOU MAY HAVE A DEDUCTIBLE THAT WILL NEED TO BE MET. SO PLEASE MAKE SURE YOU CHECK YOUR BENEFITS FOR A SCREENING COLONOSCOPY AS WELL AS A DIAGNOSTIC COLONOSCOPY.

## 2018-06-05 NOTE — Progress Notes (Signed)
Ok to schedule.

## 2018-06-06 ENCOUNTER — Other Ambulatory Visit: Payer: Self-pay | Admitting: Family

## 2018-06-10 ENCOUNTER — Telehealth: Payer: Self-pay | Admitting: Family Medicine

## 2018-06-10 NOTE — Telephone Encounter (Signed)
Spoke with patient.  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  Advised patient to [facility] on his license.  Patient agreeable.

## 2018-06-10 NOTE — Telephone Encounter (Signed)
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 PT is wanting to talk to someone about this.

## 2018-06-18 ENCOUNTER — Telehealth: Payer: Self-pay | Admitting: Gastroenterology

## 2018-06-18 ENCOUNTER — Ambulatory Visit (HOSPITAL_COMMUNITY)
Admission: RE | Admit: 2018-06-18 | Discharge: 2018-06-18 | Disposition: A | Discharge status: Home / Self Care | Payer: BLUE CROSS/BLUE SHIELD | Source: Ambulatory Visit | Attending: Internal Medicine | Admitting: Internal Medicine

## 2018-06-18 ENCOUNTER — Encounter (HOSPITAL_COMMUNITY)
Admission: RE | Disposition: A | Discharge status: Home / Self Care | Payer: Self-pay | Source: Ambulatory Visit | Attending: Internal Medicine

## 2018-06-18 ENCOUNTER — Other Ambulatory Visit: Payer: Self-pay

## 2018-06-18 DIAGNOSIS — Z79890 Hormone replacement therapy (postmenopausal): Secondary | ICD-10-CM | POA: Diagnosis not present

## 2018-06-18 DIAGNOSIS — Z85850 Personal history of malignant neoplasm of thyroid: Secondary | ICD-10-CM | POA: Diagnosis not present

## 2018-06-18 DIAGNOSIS — Z538 Procedure and treatment not carried out for other reasons: Secondary | ICD-10-CM | POA: Diagnosis not present

## 2018-06-18 DIAGNOSIS — Z1211 Encounter for screening for malignant neoplasm of colon: Secondary | ICD-10-CM | POA: Diagnosis not present

## 2018-06-18 HISTORY — PX: FLEXIBLE SIGMOIDOSCOPY: SHX5431

## 2018-06-18 SURGERY — SIGMOIDOSCOPY, FLEXIBLE
Anesthesia: Moderate Sedation

## 2018-06-18 MED ORDER — MIDAZOLAM HCL 5 MG/5ML IJ SOLN
INTRAMUSCULAR | Status: DC | PRN
  Administered 2018-06-18: 2 mg via INTRAVENOUS

## 2018-06-18 MED ORDER — ONDANSETRON HCL 4 MG/2ML IJ SOLN
INTRAMUSCULAR | Status: AC
  Filled 2018-06-18: qty 2

## 2018-06-18 MED ORDER — ONDANSETRON HCL 4 MG/2ML IJ SOLN
INTRAMUSCULAR | Status: DC | PRN
  Administered 2018-06-18: 4 mg via INTRAVENOUS

## 2018-06-18 MED ORDER — STERILE WATER FOR IRRIGATION IR SOLN
Status: DC | PRN
  Administered 2018-06-18: 1.5 mL

## 2018-06-18 MED ORDER — MIDAZOLAM HCL 5 MG/5ML IJ SOLN
INTRAMUSCULAR | Status: AC
  Filled 2018-06-18: qty 10

## 2018-06-18 MED ORDER — SODIUM CHLORIDE 0.9 % IV SOLN
INTRAVENOUS | Status: DC
  Administered 2018-06-18: 10:00:00 via INTRAVENOUS

## 2018-06-18 MED ORDER — MEPERIDINE HCL 50 MG/ML IJ SOLN
INTRAMUSCULAR | Status: AC
  Filled 2018-06-18: qty 1

## 2018-06-18 MED ORDER — MEPERIDINE HCL 100 MG/ML IJ SOLN
INTRAMUSCULAR | Status: DC | PRN
  Administered 2018-06-18: 25 mg

## 2018-06-18 NOTE — Telephone Encounter (Signed)
Endo called and said patient was not fully prepped and could not do his procedure, needs to be rescheduled.

## 2018-06-18 NOTE — Discharge Instructions (Signed)
°  Colonoscopy Discharge Instructions  Read the instructions outlined below and refer to this sheet in the next few weeks. These discharge instructions provide you with general information on caring for yourself after you leave the hospital. Your doctor may also give you specific instructions. While your treatment has been planned according to the most current medical practices available, unavoidable complications occasionally occur. If you have any problems or questions after discharge, call Dr. Gala Romney at [phone]. ACTIVITY  You may resume your regular activity, but move at a slower pace for the next 24 hours.   Take frequent rest periods for the next 24 hours.   Walking will help get rid of the air and reduce the bloated feeling in your belly (abdomen).   No driving for 24 hours (because of the medicine (anesthesia) used during the test).    Do not sign any important legal documents or operate any machinery for 24 hours (because of the anesthesia used during the test).  NUTRITION  Drink plenty of fluids.   You may resume your normal diet as instructed by your doctor.   Begin with a light meal and progress to your normal diet. Heavy or fried foods are harder to digest and may make you feel sick to your stomach (nauseated).   Avoid alcoholic beverages for 24 hours or as instructed.  MEDICATIONS  You may resume your normal medications unless your doctor tells you otherwise.  WHAT YOU CAN EXPECT TODAY  Some feelings of bloating in the abdomen.   Passage of more gas than usual.   Spotting of blood in your stool or on the toilet paper.  IF YOU HAD POLYPS REMOVED DURING THE COLONOSCOPY:  No aspirin products for 7 days or as instructed.   No alcohol for 7 days or as instructed.   Eat a soft diet for the next 24 hours.  FINDING OUT THE RESULTS OF YOUR TEST Not all test results are available during your visit. If your test results are not back during the visit, make an appointment  with your caregiver to find out the results. Do not assume everything is normal if you have not heard from your caregiver or the medical facility. It is important for you to follow up on all of your test results.  SEEK IMMEDIATE MEDICAL ATTENTION IF:  You have more than a spotting of blood in your stool.   Your belly is swollen (abdominal distention).   You are nauseated or vomiting.   You have a temperature over 101.   You have abdominal pain or discomfort that is severe or gets worse throughout the day.    Your colonoscopy could not be done today because your colon was not prepped.  Return to the office to reschedule

## 2018-06-18 NOTE — H&P (Signed)
@LOGO @   Primary Care Physician:  Claretta Fraise, MD Primary Gastroenterologist:  Dr. Gala Romney  Pre-Procedure History & Physical: HPI:  Angel Chambers is a 62 y.o. male is here for a screening colonoscopy.   Negative colonoscopy over 10 years ago.  Further details unknown.  No bowel symptoms.  No family history of colon cancer.  Past Medical History:  Diagnosis Date  . History of kidney stones   . Thyroid cancer [facility])     Past Surgical History:  Procedure Laterality Date  . THYROIDECTOMY N/A 01/14/2018   Procedure: TOTAL THYROIDECTOMY;  Surgeon: Aviva Signs, MD;  Location: [facility];  Service: General;  Laterality: N/A;  . WRIST FRACTURE SURGERY Left 2004    Prior to Admission medications   Medication Sig Start Date End Date Taking? Authorizing Provider  calcium-vitamin D (OSCAL WITH D) 500-200 MG-UNIT tablet Take 2 tablets by mouth 2 (two) times daily. Patient taking differently: Take 1 tablet by mouth 2 (two) times daily.  01/15/18  Yes Aviva Signs, MD  levothyroxine (SYNTHROID, LEVOTHROID) 125 MCG tablet Take 125 mcg by mouth daily before breakfast.   Yes [provider]  polyethylene glycol-electrolytes (TRILYTE) 420 g solution Take 4,000 mLs by mouth as directed. 06/05/18  Yes Mahala Menghini, PA-C  levothyroxine (SYNTHROID, LEVOTHROID) 100 MCG tablet Take 1 tablet (100 mcg total) by mouth daily before breakfast. Patient not taking: Reported on 06/06/2018 01/15/18   Aviva Signs, MD    Allergies as of 06/05/2018  . (No Known Allergies)    Family History  Problem Relation Age of Onset  . Diabetes Mother   . Kidney disease Mother   . Heart attack Father   . Cancer Sister        hysterectomy due to cancer  . Hypertension Brother   . Heart disease Brother   . Thyroid disease Brother     Social History   Socioeconomic History  . Marital status: Married    Spouse name: Not on file  . Number of children: Not on file  . Years of education: Not on file  . Highest  education level: Not on file  Occupational History  . Not on file  Social Needs  . Financial resource strain: Not on file  . Food insecurity:    Worry: Not on file    Inability: Not on file  . Transportation needs:    Medical: Not on file    Non-medical: Not on file  Tobacco Use  . Smoking status: Never Smoker  . Smokeless tobacco: Never Used  Substance and Sexual Activity  . Alcohol use: No  . Drug use: No  . Sexual activity: Yes  Lifestyle  . Physical activity:    Days per week: Not on file    Minutes per session: Not on file  . Stress: Not on file  Relationships  . Social connections:    Talks on phone: Not on file    Gets together: Not on file    Attends religious service: Not on file    Active member of club or organization: Not on file    Attends meetings of clubs or organizations: Not on file    Relationship status: Not on file  . Intimate partner violence:    Fear of current or ex partner: Not on file    Emotionally abused: Not on file    Physically abused: Not on file    Forced sexual activity: Not on file  Other Topics Concern  . Not  on file  Social History Narrative  . Not on file    Review of Systems: See HPI, otherwise negative ROS  Physical Exam: BP 133/82   Pulse (!) 55   Temp 97.8 F (36.6 C) (Oral)   Resp 12   SpO2 100%  General:   Alert,  Well-developed, well-nourished, pleasant and cooperative in NAD Neck:  Supple; no masses or thyromegaly. Lungs:  Clear throughout to auscultation.   No wheezes, crackles, or rhonchi. No acute distress. Heart:  Regular rate and rhythm; no murmurs, clicks, rubs,  or gallops. Abdomen:  Soft, nontender and nondistended. No masses, hepatosplenomegaly or hernias noted. Normal bowel sounds, without guarding, and without rebound.   Impression/Plan: Angel Chambers is now here to undergo a screening colonoscopy.  Average risk screening examination.  Risks, benefits, limitations, imponderables and alternatives  regarding colonoscopy have been reviewed with the patient. Questions have been answered. All parties agreeable.     Notice:  This dictation was prepared with Dragon dictation along with smaller phrase technology. Any transcriptional errors that result from this process are unintentional and may not be corrected upon review.

## 2018-06-18 NOTE — Op Note (Signed)
[facility] Patient Name: Angel Chambers Procedure Date: 06/18/2018 9:48 AM MRN: [id number] Date of Birth: [birth year] Attending MD: Norvel Richards , MD CSN: [id number] Age: 62 Admit Type: Outpatient Procedure:                Colonoscopy Indications:              Screening for colorectal malignant neoplasm Providers:                Norvel Richards, MD, Gerome Sam, RN,                            Rosina Lowenstein, RN Referring MD:             Claretta Fraise Medicines:                Midazolam 2 mg IV, Meperidine 25 mg IV Complications:            No immediate complications. Estimated Blood Loss:     Estimated blood loss: none. Procedure:                Pre-Anesthesia Assessment:                           - Prior to the procedure, a History and Physical                            was performed, and patient medications and                            allergies were reviewed. The patient's tolerance of                            previous anesthesia was also reviewed. The risks                            and benefits of the procedure and the sedation                            options and risks were discussed with the patient.                            All questions were answered, and informed consent                            was obtained. Prior Anticoagulants: The patient has                            taken no previous anticoagulant or antiplatelet                            agents. ASA Grade Assessment: II - A patient with                            mild systemic disease. After reviewing the risks  and benefits, the patient was deemed in                            satisfactory condition to undergo the procedure.                           After obtaining informed consent, the colonoscope                            was passed under direct vision. Throughout the                            procedure, the patient's blood pressure, pulse, and                             oxygen saturations were monitored continuously. The                            [id number]-[id number] ([id number]) scope was introduced through                            the anus and advanced to the the sigmoid colon for                            evaluation. This was the intended extent. The                            colonoscopy was aborted due to inadequate bowel                            prep. Scope In: 10:14:42 AM Scope Out: 10:16:29 AM Total Procedure Duration: 0 hours 1 minute 47 seconds  Findings:      The perianal and digital rectal examinations were normal. Prep       inadequate formed and semi-formed stool in the rectum and distal sigmoid       which precluded colonoscopy today. Impression:               - The procedure was aborted due to inadequate bowel                            prep.                           - No specimens collected. Moderate Sedation:      Moderate (conscious) sedation was administered by the endoscopy nurse       and supervised by the endoscopist. The following parameters were       monitored: oxygen saturation, heart rate, blood pressure, respiratory       rate, EKG, adequacy of pulmonary ventilation, and response to care.       Total physician intraservice time was 6 minutes. Recommendation:           - Patient has a contact number available for  emergencies. The signs and symptoms of potential                            delayed complications were discussed with the                            patient. Return to normal activities tomorrow.                            Written discharge instructions were provided to the                            patient. Return to the office to reschedule a                            colonoscopy. Procedure Code(s):        --- Professional ---                           [phone], 76, Colonoscopy, flexible; diagnostic,                            including collection of specimen(s) by brushing or                             washing, when performed (separate procedure) Diagnosis Code(s):        --- Professional ---                           Z12.11, Encounter for screening for malignant                            neoplasm of colon                           Z53.8, Procedure and treatment not carried out for                            other reasons CPT copyright 2018 American Medical Association. All rights reserved. The codes documented in this report are preliminary and upon coder review may  be revised to meet current compliance requirements. [name]. Doron Shake, MD Norvel Richards, MD 06/18/2018 10:24:21 AM This report has been signed electronically. Number of Addenda: 0

## 2018-06-21 NOTE — Telephone Encounter (Signed)
Pt had a poor prep. He will need an office visit.

## 2018-06-24 ENCOUNTER — Encounter: Payer: Self-pay | Admitting: Gastroenterology

## 2018-06-24 NOTE — Telephone Encounter (Signed)
SCHEDULED AND LETTER SENT  °

## 2018-06-25 ENCOUNTER — Encounter (HOSPITAL_COMMUNITY): Payer: Self-pay | Admitting: Internal Medicine

## 2018-08-15 ENCOUNTER — Encounter (HOSPITAL_COMMUNITY): Payer: Self-pay

## 2018-08-28 ENCOUNTER — Other Ambulatory Visit: Payer: Self-pay

## 2018-08-28 ENCOUNTER — Encounter: Payer: Self-pay | Admitting: Nurse Practitioner

## 2018-08-28 ENCOUNTER — Ambulatory Visit: Payer: BLUE CROSS/BLUE SHIELD | Admitting: Nurse Practitioner

## 2018-08-28 ENCOUNTER — Encounter: Payer: Self-pay | Admitting: *Deleted

## 2018-08-28 DIAGNOSIS — Z0000 Encounter for general adult medical examination without abnormal findings: Secondary | ICD-10-CM | POA: Diagnosis not present

## 2018-08-28 DIAGNOSIS — Z1211 Encounter for screening for malignant neoplasm of colon: Secondary | ICD-10-CM

## 2018-08-28 MED ORDER — PEG 3350-KCL-NA BICARB-NACL 420 G PO SOLR: 4000.0000 mL | Freq: Once | ORAL | 1 refills | Status: AC

## 2018-08-28 NOTE — Assessment & Plan Note (Signed)
The patient previously had an attempted colonoscopy at the end of December 2019 but poor prep because the procedure to be aborted.  He is back in the office to reschedule follow-up colonoscopy.  Generally asymptomatic from a GI standpoint.  This point we will reschedule colonoscopy with extended prep.  Follow-up based on post procedure recommendations.  Proceed with TCS with Dr. Gala Romney in near future: the risks, benefits, and alternatives have been discussed with the patient in detail. The patient states understanding and desires to proceed.  The patient is not on any anticoagulants, anxiolytics, chronic pain medications, or antidepressants.  Conscious sedation should be adequate for his procedure

## 2018-08-28 NOTE — Patient Instructions (Signed)
Your health issues we discussed today were:   Need for colonoscopy: 1. We will schedule your repeat colonoscopy for you. 2. We will give you different instructions to include 2 days of clear liquids, take Linzess 72 mcg once a day for 5 days prior to starting your prep and we will have you take additional prep medicine as well.  The scheduling people will give you better, more clear instructions 3. Further recommendations will be made after your colonoscopy  Overall I recommend:  1. Follow-up based on recommendations made after colonoscopy 2. Call us if you have any questions or concerns.    Because of recent events of COVID-19 ("Coronavirus"), follow CDC recommendations:  Wash your hand frequently Avoid touching your face Stay away from people who are sick If you have symptoms such as fever, cough, shortness of breath then call your healthcare provider for further guidance If you are sick, STAY AT HOME unless otherwise directed by your healthcare provider.    At [facility] we value your feedback. You may receive a survey about your visit today. Please share your experience as we strive to create trusting relationships with our patients to provide genuine, compassionate, quality care.  We appreciate your understanding and patience as we review any laboratory studies, imaging, and other diagnostic tests that are ordered as we care for you. Our office policy is 5 business days for review of these results, and any emergent or urgent results are addressed in a timely manner for your best interest. If you do not hear from our office in 1 week, please contact us.   We also encourage the use of MyChart, which contains your medical information for your review as well. If you are not enrolled in this feature, an access code is on this after visit summary for your convenience. Thank you for allowing [place] to be involved in your care.  It was great to see you today!  I hope you have a  great day!!

## 2018-08-28 NOTE — Progress Notes (Signed)
Referring Provider: Claretta Fraise, MD Primary Care Physician:  Claretta Fraise, MD Primary GI:  Dr. Gala Romney  Chief Complaint  Patient presents with  . Colonoscopy    had poor prep    HPI:   Angel Chambers is a 63 y.o. male who presents to reschedule colonoscopy due to poor prep.  Previous colonoscopy completed 06/18/2018 for screening for colorectal malignant neoplasm.  Findings include aborted procedure due to inadequate prep.  Recommended follow-up in the office to reschedule colonoscopy.  Today states he's doing well overall. He drank his whole prep but didn't start having bowel movements until the night of his procedure. Denies abdominal pain, N/V, hematochezia, melena, fever, chills, unintentional weight loss. Denies chest pain, dyspnea, dizziness, lightheadedness, syncope, near syncope. Denies any other upper or lower GI symptoms.  Past Medical History:  Diagnosis Date  . History of kidney stones   . Thyroid cancer [facility])     Past Surgical History:  Procedure Laterality Date  . FLEXIBLE SIGMOIDOSCOPY N/A 06/18/2018   Procedure: FLEXIBLE SIGMOIDOSCOPY;  Surgeon: Daneil Dolin, MD;  Location: [facility];  Service: Endoscopy;  Laterality: N/A;  . THYROIDECTOMY N/A 01/14/2018   Procedure: TOTAL THYROIDECTOMY;  Surgeon: Aviva Signs, MD;  Location: [facility];  Service: General;  Laterality: N/A;  . WRIST FRACTURE SURGERY Left 2004    Current Outpatient Medications  Medication Sig Dispense Refill  . calcium-vitamin D (OSCAL WITH D) 500-200 MG-UNIT tablet Take 2 tablets by mouth 2 (two) times daily. (Patient taking differently: Take 1 tablet by mouth 2 (two) times daily. ) 60 tablet 1  . levothyroxine (SYNTHROID, LEVOTHROID) 137 MCG tablet Take 137 mcg by mouth daily.     No current facility-administered medications for this visit.     Allergies as of 08/28/2018  . (No Known Allergies)    Family History  Problem Relation Age of Onset  . Diabetes Mother   . Kidney  disease Mother   . Heart attack Father   . Cancer Sister        hysterectomy due to cancer  . Hypertension Brother   . Heart disease Brother   . Thyroid disease Brother   . Colon cancer Neg Hx     Social History   Socioeconomic History  . Marital status: Married    Spouse name: Not on file  . Number of children: Not on file  . Years of education: Not on file  . Highest education level: Not on file  Occupational History  . Not on file  Social Needs  . Financial resource strain: Not on file  . Food insecurity:    Worry: Not on file    Inability: Not on file  . Transportation needs:    Medical: Not on file    Non-medical: Not on file  Tobacco Use  . Smoking status: Never Smoker  . Smokeless tobacco: Never Used  Substance and Sexual Activity  . Alcohol use: No  . Drug use: No  . Sexual activity: Yes  Lifestyle  . Physical activity:    Days per week: Not on file    Minutes per session: Not on file  . Stress: Not on file  Relationships  . Social connections:    Talks on phone: Not on file    Gets together: Not on file    Attends religious service: Not on file    Active member of club or organization: Not on file    Attends meetings of clubs or organizations:  Not on file    Relationship status: Not on file  Other Topics Concern  . Not on file  Social History Narrative  . Not on file    Review of Systems: General: Negative for anorexia, weight loss, fever, chills, fatigue, weakness. ENT: Negative for hoarseness, difficulty swallowing. CV: Negative for chest pain, angina, palpitations, peripheral edema.  Respiratory: Negative for dyspnea at rest, cough, sputum, wheezing.  GI: See history of present illness. Endo: Negative for unusual weight change.  Heme: Negative for bruising or bleeding. Allergy: Negative for rash or hives.   Physical Exam: BP 127/80   Pulse 71   Temp (!) 96.5 F (35.8 C) (Oral)   Ht 6' (1.829 m)   Wt 196 lb 3.2 oz (89 kg)   BMI 26.61  kg/m  General:   Alert and oriented. Pleasant and cooperative. Well-nourished and well-developed.  Ears:  Normal auditory acuity. Cardiovascular:  S1, S2 present without murmurs appreciated. Extremities without clubbing or edema. Respiratory:  Clear to auscultation bilaterally. No wheezes, rales, or rhonchi. No distress.  Gastrointestinal:  +BS, soft, non-tender and non-distended. No HSM noted. No guarding or rebound. No masses appreciated.  Rectal:  Deferred  Musculoskalatal:  Symmetrical without gross deformities. Neurologic:  Alert and oriented x4;  grossly normal neurologically. Psych:  Alert and cooperative. Normal mood and affect. Heme/Lymph/Immune: No excessive bruising noted.    08/28/2018 3:30 PM   Disclaimer: This note was dictated with voice recognition software. Similar sounding words can inadvertently be transcribed and may not be corrected upon review.

## 2018-08-29 NOTE — Progress Notes (Signed)
cc'ed to pcp °

## 2018-10-10 ENCOUNTER — Telehealth: Payer: Self-pay | Admitting: *Deleted

## 2018-10-10 NOTE — Telephone Encounter (Signed)
LMTCB to r/s TCS with RMR that is currently scheduled for 10/22/2018

## 2018-10-10 NOTE — Telephone Encounter (Signed)
Patient called back. He is r/s'd to 7/7 at 9:30am. Patient aware will mail new instructions (address confirmed). Called endo and spoke with carolyn-she is aware of new appt details.

## 2018-10-30 ENCOUNTER — Other Ambulatory Visit: Payer: Self-pay

## 2018-10-31 ENCOUNTER — Encounter: Payer: Self-pay | Admitting: Family Medicine

## 2018-10-31 ENCOUNTER — Other Ambulatory Visit: Payer: Self-pay

## 2018-10-31 ENCOUNTER — Ambulatory Visit (INDEPENDENT_AMBULATORY_CARE_PROVIDER_SITE_OTHER): Payer: BLUE CROSS/BLUE SHIELD | Admitting: Family Medicine

## 2018-10-31 VITALS — BP 122/71 | HR 67 | Temp 97.3°F | Ht 72.0 in | Wt 192.8 lb

## 2018-10-31 DIAGNOSIS — E038 Other specified hypothyroidism: Secondary | ICD-10-CM | POA: Diagnosis not present

## 2018-10-31 DIAGNOSIS — Z0001 Encounter for general adult medical examination with abnormal findings: Secondary | ICD-10-CM

## 2018-10-31 DIAGNOSIS — E559 Vitamin D deficiency, unspecified: Secondary | ICD-10-CM

## 2018-10-31 DIAGNOSIS — Z0000 Encounter for general adult medical examination without abnormal findings: Secondary | ICD-10-CM

## 2018-10-31 DIAGNOSIS — F985 Adult onset fluency disorder: Secondary | ICD-10-CM | POA: Diagnosis not present

## 2018-10-31 DIAGNOSIS — I10 Essential (primary) hypertension: Secondary | ICD-10-CM | POA: Diagnosis not present

## 2018-10-31 LAB — URINALYSIS
Bilirubin, UA: NEGATIVE
Glucose, UA: NEGATIVE
Ketones, UA: NEGATIVE
Leukocytes,UA: NEGATIVE
Nitrite, UA: NEGATIVE
Protein,UA: NEGATIVE
RBC, UA: NEGATIVE
Specific Gravity, UA: >1.03 — ABNORMAL HIGH (ref 1.005–1.030)
Urobilinogen, Ur: 0.2 mg/dL (ref 0.2–1.0)
pH, UA: 5 (ref 5.0–7.5)

## 2018-10-31 NOTE — Progress Notes (Signed)
Subjective:  Patient ID: Angel Chambers, male    DOB: Apr 22, 1956  Age: 63 y.o. MRN: [id number]  CC: Annual Exam   HPI Angel Chambers presents for Annual physical  Patient presents for follow-up on  thyroid. The patient has a history of hypothyroidism for many years. It has been stable recently. Pt. denies any change in  voice, loss of hair, heat or cold intolerance. Energy level has been adequate to good. Patient denies constipation and diarrhea. No myxedema. Medication is as noted below. Verified that pt is taking it daily on an empty stomach. Well tolerated.    Depression screen [facility] 2/9 10/31/2018 05/31/2018 01/18/2018  Decreased Interest 0 0 0  Down, Depressed, Hopeless 0 0 0  PHQ - 2 Score 0 0 0    History Cobe has a past medical history of History of kidney stones and Thyroid cancer ([name]).   He has a past surgical history that includes Wrist fracture surgery (Left, 2004); Thyroidectomy (N/A, 01/14/2018); and Flexible sigmoidoscopy (N/A, 06/18/2018).   His family history includes Cancer in his sister; Diabetes in his mother; Heart attack in his father; Heart disease in his brother; Hypertension in his brother; Kidney disease in his mother; Thyroid disease in his brother.He reports that he has never smoked. He has never used smokeless tobacco. He reports that he does not drink alcohol or use drugs.    ROS Review of Systems  Constitutional: Negative for activity change, fatigue and unexpected weight change.  HENT: Negative for congestion, ear pain, hearing loss, postnasal drip and trouble swallowing.   Eyes: Negative for pain and visual disturbance.  Respiratory: Negative for cough, chest tightness and shortness of breath.   Cardiovascular: Negative for chest pain, palpitations and leg swelling.  Gastrointestinal: Negative for abdominal distention, abdominal pain, blood in stool, constipation, diarrhea, nausea and vomiting.  Endocrine: Negative for cold intolerance, heat intolerance  and polydipsia.  Genitourinary: Negative for difficulty urinating, dysuria, flank pain, frequency and urgency.  Musculoskeletal: Negative for arthralgias and joint swelling.  Skin: Negative for color change, rash and wound.  Neurological: Negative for dizziness, syncope, speech difficulty, weakness, light-headedness, numbness and headaches.  Hematological: Does not bruise/bleed easily.  Psychiatric/Behavioral: Negative for confusion, decreased concentration, dysphoric mood and sleep disturbance. The patient is not nervous/anxious.     Objective:  BP 122/71   Pulse 67   Temp (!) 97.3 F (36.3 C) (Oral)   Ht 6' (1.829 m)   Wt 192 lb 12.8 oz (87.5 kg)   BMI 26.15 kg/m   BP Readings from Last 3 Encounters:  10/31/18 122/71  08/28/18 127/80  06/18/18 122/83    Wt Readings from Last 3 Encounters:  10/31/18 192 lb 12.8 oz (87.5 kg)  08/28/18 196 lb 3.2 oz (89 kg)  05/31/18 198 lb (89.8 kg)     Physical Exam Constitutional:      Appearance: He is well-developed.  HENT:     Head: Normocephalic and atraumatic.  Eyes:     Pupils: Pupils are equal, round, and reactive to light.  Neck:     Musculoskeletal: Normal range of motion.     Thyroid: No thyromegaly.     Trachea: No tracheal deviation.  Cardiovascular:     Rate and Rhythm: Normal rate and regular rhythm.     Heart sounds: Normal heart sounds. No murmur. No friction rub. No gallop.   Pulmonary:     Breath sounds: Normal breath sounds. No wheezing or rales.  Abdominal:  General: Bowel sounds are normal. There is no distension.     Palpations: Abdomen is soft. There is no mass.     Tenderness: There is no abdominal tenderness.     Hernia: There is no hernia in the right inguinal area or left inguinal area.  Genitourinary:    Penis: Normal.      Scrotum/Testes: Normal.  Musculoskeletal: Normal range of motion.  Lymphadenopathy:     Cervical: No cervical adenopathy.  Skin:    General: Skin is warm and dry.   Neurological:     Mental Status: He is alert and oriented to person, place, and time.       Assessment & Plan:   There are no diagnoses linked to this encounter.     I have discontinued [name] T. Masterson's calcium-vitamin D. I am also having him maintain his levothyroxine and Vitamin D3.  Allergies as of 10/31/2018   No Known Allergies     Medication List       Accurate as of Oct 31, 2018  9:32 AM. If you have any questions, ask your nurse or doctor.        STOP taking these medications   calcium-vitamin D 500-200 MG-UNIT tablet Commonly known as:  OSCAL WITH D Stopped by:  Claretta Fraise, MD     TAKE these medications   levothyroxine 137 MCG tablet Commonly known as:  SYNTHROID Take 137 mcg by mouth daily.   Vitamin D3 50 MCG (2000 UT) Tabs Take 50 mcg by mouth 2 (two) times a day.        Follow-up: No follow-ups on file.  Claretta Fraise, M.D.

## 2018-11-01 LAB — LIPID PANEL
Chol/HDL Ratio: 3.7 ratio (ref 0.0–5.0)
Cholesterol, Total: 194 mg/dL (ref 100–199)
HDL: 53 mg/dL (ref >39)
LDL Calculated: 105 mg/dL — ABNORMAL HIGH (ref 0–99)
Triglycerides: 178 mg/dL — ABNORMAL HIGH (ref 0–149)
VLDL Cholesterol Cal: 36 mg/dL (ref 5–40)

## 2018-11-01 LAB — CMP14+EGFR
ALT: 20 IU/L (ref 0–44)
AST: 19 IU/L (ref 0–40)
Albumin/Globulin Ratio: 1.8 (ref 1.2–2.2)
Albumin: 4.5 g/dL (ref 3.8–4.8)
Alkaline Phosphatase: 81 IU/L (ref 39–117)
BUN/Creatinine Ratio: 15 (ref 10–24)
BUN: 14 mg/dL (ref 8–27)
Bilirubin Total: 0.4 mg/dL (ref 0.0–1.2)
CO2: 22 mmol/L (ref 20–29)
Calcium: 9.3 mg/dL (ref 8.6–10.2)
Chloride: 106 mmol/L (ref 96–106)
Creatinine, Ser: 0.93 mg/dL (ref 0.76–1.27)
GFR calc Af Amer: 102 mL/min/{1.73_m2} (ref >59)
GFR calc non Af Amer: 88 mL/min/{1.73_m2} (ref >59)
Globulin, Total: 2.5 g/dL (ref 1.5–4.5)
Glucose: 96 mg/dL (ref 65–99)
Potassium: 4.4 mmol/L (ref 3.5–5.2)
Sodium: 140 mmol/L (ref 134–144)
Total Protein: 7 g/dL (ref 6.0–8.5)

## 2018-11-01 LAB — CBC WITH DIFFERENTIAL/PLATELET
Basophils Absolute: 0 10*3/uL (ref 0.0–0.2)
Basos: 1 %
EOS (ABSOLUTE): 0.1 10*3/uL (ref 0.0–0.4)
Eos: 2 %
Hematocrit: 45.2 % (ref 37.5–51.0)
Hemoglobin: 15.8 g/dL (ref 13.0–17.7)
Immature Grans (Abs): 0 10*3/uL (ref 0.0–0.1)
Immature Granulocytes: 0 %
Lymphocytes Absolute: 1.5 10*3/uL (ref 0.7–3.1)
Lymphs: 32 %
MCH: 32.4 pg (ref 26.6–33.0)
MCHC: 35 g/dL (ref 31.5–35.7)
MCV: 93 fL (ref 79–97)
Monocytes Absolute: 0.4 10*3/uL (ref 0.1–0.9)
Monocytes: 9 %
Neutrophils Absolute: 2.6 10*3/uL (ref 1.4–7.0)
Neutrophils: 56 %
Platelets: 186 10*3/uL (ref 150–450)
RBC: 4.88 x10E6/uL (ref 4.14–5.80)
RDW: 13 % (ref 11.6–15.4)
WBC: 4.6 10*3/uL (ref 3.4–10.8)

## 2018-11-01 LAB — PSA, TOTAL AND FREE
PSA, Free Pct: 24.7 %
PSA, Free: 0.94 ng/mL
Prostate Specific Ag, Serum: 3.8 ng/mL (ref 0.0–4.0)

## 2018-11-01 LAB — TSH: TSH: 0.011 u[IU]/mL — ABNORMAL LOW (ref 0.450–4.500)

## 2018-11-01 LAB — VITAMIN D 25 HYDROXY (VIT D DEFICIENCY, FRACTURES): Vit D, 25-Hydroxy: 56.6 ng/mL (ref 30.0–100.0)

## 2018-11-03 DIAGNOSIS — F985 Adult onset fluency disorder: Secondary | ICD-10-CM | POA: Insufficient documentation

## 2018-11-03 DIAGNOSIS — I10 Essential (primary) hypertension: Secondary | ICD-10-CM | POA: Insufficient documentation

## 2018-11-03 DIAGNOSIS — E038 Other specified hypothyroidism: Secondary | ICD-10-CM | POA: Insufficient documentation

## 2018-11-05 DIAGNOSIS — E890 Postprocedural hypothyroidism: Secondary | ICD-10-CM | POA: Diagnosis not present

## 2018-11-05 DIAGNOSIS — C73 Malignant neoplasm of thyroid gland: Secondary | ICD-10-CM | POA: Diagnosis not present

## 2018-11-08 DIAGNOSIS — C73 Malignant neoplasm of thyroid gland: Secondary | ICD-10-CM | POA: Diagnosis not present

## 2018-11-08 DIAGNOSIS — E079 Disorder of thyroid, unspecified: Secondary | ICD-10-CM | POA: Diagnosis not present

## 2018-12-12 ENCOUNTER — Telehealth: Payer: Self-pay | Admitting: *Deleted

## 2018-12-12 NOTE — Telephone Encounter (Signed)
Pt is scheduled for his COVID 19 screening on 12/19/2018. Pt is aware to remain in quarantine once testing is done.  Pt voiced understanding.

## 2018-12-19 ENCOUNTER — Other Ambulatory Visit (HOSPITAL_COMMUNITY)
Admission: RE | Admit: 2018-12-19 | Discharge: 2018-12-19 | Disposition: A | Discharge status: Home / Self Care | Payer: BC Managed Care – PPO | Source: Ambulatory Visit | Attending: Internal Medicine | Admitting: Internal Medicine

## 2018-12-19 DIAGNOSIS — Z1159 Encounter for screening for other viral diseases: Secondary | ICD-10-CM | POA: Diagnosis not present

## 2018-12-19 DIAGNOSIS — Z01812 Encounter for preprocedural laboratory examination: Secondary | ICD-10-CM | POA: Insufficient documentation

## 2018-12-20 LAB — SARS CORONAVIRUS 2 (TAT 6-24 HRS): SARS Coronavirus 2: NEGATIVE

## 2018-12-24 ENCOUNTER — Ambulatory Visit (HOSPITAL_COMMUNITY)
Admission: RE | Admit: 2018-12-24 | Discharge: 2018-12-24 | Disposition: A | Discharge status: Home / Self Care | Payer: BC Managed Care – PPO | Source: Ambulatory Visit | Attending: Internal Medicine | Admitting: Internal Medicine

## 2018-12-24 ENCOUNTER — Other Ambulatory Visit: Payer: Self-pay

## 2018-12-24 ENCOUNTER — Encounter (HOSPITAL_COMMUNITY)
Admission: RE | Disposition: A | Discharge status: Home / Self Care | Payer: Self-pay | Source: Ambulatory Visit | Attending: Internal Medicine

## 2018-12-24 ENCOUNTER — Encounter (HOSPITAL_COMMUNITY): Payer: Self-pay | Admitting: *Deleted

## 2018-12-24 DIAGNOSIS — Z79890 Hormone replacement therapy (postmenopausal): Secondary | ICD-10-CM | POA: Diagnosis not present

## 2018-12-24 DIAGNOSIS — Z1211 Encounter for screening for malignant neoplasm of colon: Secondary | ICD-10-CM | POA: Insufficient documentation

## 2018-12-24 HISTORY — PX: COLONOSCOPY: SHX5424

## 2018-12-24 SURGERY — COLONOSCOPY
Anesthesia: Moderate Sedation

## 2018-12-24 MED ORDER — STERILE WATER FOR IRRIGATION IR SOLN
Status: DC | PRN
  Administered 2018-12-24: 09:00:00 1.5 mL

## 2018-12-24 MED ORDER — SODIUM CHLORIDE 0.9 % IV SOLN
INTRAVENOUS | Status: DC
  Administered 2018-12-24: 09:00:00 via INTRAVENOUS

## 2018-12-24 MED ORDER — MEPERIDINE HCL 100 MG/ML IJ SOLN
INTRAMUSCULAR | Status: DC | PRN
  Administered 2018-12-24: 25 mg

## 2018-12-24 MED ORDER — ONDANSETRON HCL 4 MG/2ML IJ SOLN
INTRAMUSCULAR | Status: DC | PRN
  Administered 2018-12-24: 4 mg via INTRAVENOUS

## 2018-12-24 MED ORDER — MEPERIDINE HCL 50 MG/ML IJ SOLN
INTRAMUSCULAR | Status: AC
  Filled 2018-12-24: qty 1

## 2018-12-24 MED ORDER — ONDANSETRON HCL 4 MG/2ML IJ SOLN
INTRAMUSCULAR | Status: AC
  Filled 2018-12-24: qty 2

## 2018-12-24 MED ORDER — MIDAZOLAM HCL 5 MG/5ML IJ SOLN
INTRAMUSCULAR | Status: AC
  Filled 2018-12-24: qty 10

## 2018-12-24 MED ORDER — MIDAZOLAM HCL 5 MG/5ML IJ SOLN
INTRAMUSCULAR | Status: DC | PRN
  Administered 2018-12-24: 1 mg via INTRAVENOUS
  Administered 2018-12-24: 2 mg via INTRAVENOUS
  Administered 2018-12-24: 1 mg via INTRAVENOUS
  Administered 2018-12-24: 2 mg via INTRAVENOUS

## 2018-12-24 NOTE — Discharge Instructions (Signed)
°  Colonoscopy Discharge Instructions  Read the instructions outlined below and refer to this sheet in the next few weeks. These discharge instructions provide you with general information on caring for yourself after you leave the hospital. Your doctor may also give you specific instructions. While your treatment has been planned according to the most current medical practices available, unavoidable complications occasionally occur. If you have any problems or questions after discharge, call Dr. Gala Romney at [phone]. ACTIVITY  You may resume your regular activity, but move at a slower pace for the next 24 hours.   Take frequent rest periods for the next 24 hours.   Walking will help get rid of the air and reduce the bloated feeling in your belly (abdomen).   No driving for 24 hours (because of the medicine (anesthesia) used during the test).    Do not sign any important legal documents or operate any machinery for 24 hours (because of the anesthesia used during the test).  NUTRITION  Drink plenty of fluids.   You may resume your normal diet as instructed by your doctor.   Begin with a light meal and progress to your normal diet. Heavy or fried foods are harder to digest and may make you feel sick to your stomach (nauseated).   Avoid alcoholic beverages for 24 hours or as instructed.  MEDICATIONS  You may resume your normal medications unless your doctor tells you otherwise.  WHAT YOU CAN EXPECT TODAY  Some feelings of bloating in the abdomen.   Passage of more gas than usual.   Spotting of blood in your stool or on the toilet paper.  IF YOU HAD POLYPS REMOVED DURING THE COLONOSCOPY:  No aspirin products for 7 days or as instructed.   No alcohol for 7 days or as instructed.   Eat a soft diet for the next 24 hours.  FINDING OUT THE RESULTS OF YOUR TEST Not all test results are available during your visit. If your test results are not back during the visit, make an appointment  with your caregiver to find out the results. Do not assume everything is normal if you have not heard from your caregiver or the medical facility. It is important for you to follow up on all of your test results.  SEEK IMMEDIATE MEDICAL ATTENTION IF:  You have more than a spotting of blood in your stool.   Your belly is swollen (abdominal distention).   You are nauseated or vomiting.   You have a temperature over 101.   You have abdominal pain or discomfort that is severe or gets worse throughout the day.   Repeat colonoscopy in 10 years for screening purposes  I discussed my findings and recommendations with wife, Marlowe Kays, at [phone]

## 2018-12-24 NOTE — Op Note (Signed)
[facility] Patient Name: Angel Chambers Procedure Date: 12/24/2018 8:47 AM MRN: [id number] Date of Birth: 1955/09/04 Attending MD: Norvel Richards , MD CSN: [id number] Age: 63 Admit Type: Outpatient Procedure:                Colonoscopy Indications:              Screening for colorectal malignant neoplasm Providers:                Norvel Richards, MD, Gerome Sam, RN,                            Aram Candela Referring MD:              Medicines:                Meperidine 25 mg IV, Midazolam 6 mg IV, Ondansetron                            4 mg IV Complications:            No immediate complications. Estimated Blood Loss:     Estimated blood loss: none. Procedure:                Pre-Anesthesia Assessment:                           - Prior to the procedure, a History and Physical                            was performed, and patient medications and                            allergies were reviewed. The patient's tolerance of                            previous anesthesia was also reviewed. The risks                            and benefits of the procedure and the sedation                            options and risks were discussed with the patient.                            All questions were answered, and informed consent                            was obtained. Prior Anticoagulants: The patient has                            taken no previous anticoagulant or antiplatelet                            agents. ASA Grade Assessment: II - A patient with  mild systemic disease. After reviewing the risks                            and benefits, the patient was deemed in                            satisfactory condition to undergo the procedure.                           After obtaining informed consent, the colonoscope                            was passed under direct vision. Throughout the                            procedure, the patient's blood  pressure, pulse, and                            oxygen saturations were monitored continuously. The                            CF-[id number] ([id number]) scope was introduced through                            the anus and advanced to the the cecum, identified                            by appendiceal orifice and ileocecal valve. The                            colonoscopy was performed without difficulty. The                            patient tolerated the procedure well. The quality                            of the bowel preparation was adequate. Scope In: 9:05:20 AM Scope Out: 9:20:24 AM Scope Withdrawal Time: 0 hours 9 minutes 3 seconds  Total Procedure Duration: 0 hours 15 minutes 4 seconds  Findings:      The perianal and digital rectal examinations were normal.      The colon (entire examined portion) appeared normal.      The retroflexed view of the distal rectum and anal verge was normal and       showed no anal or rectal abnormalities. Impression:               - The entire examined colon is normal.                           - The distal rectum and anal verge are normal on                            retroflexion view.                           -  No specimens collected. Moderate Sedation:      Moderate (conscious) sedation was administered by the endoscopy nurse       and supervised by the endoscopist. The following parameters were       monitored: oxygen saturation, heart rate, blood pressure, respiratory       rate, EKG, adequacy of pulmonary ventilation, and response to care. Recommendation:           - Patient has a contact number available for                            emergencies. The signs and symptoms of potential                            delayed complications were discussed with the                            patient. Return to normal activities tomorrow.                            Written discharge instructions were provided to the                            patient.                            - Resume previous diet.                           - Continue present medications.                           - Await pathology results.                           - Repeat colonoscopy in 10 years for screening                            purposes.                           - Return to GI office (date not yet determined). Procedure Code(s):        --- Professional ---                           [phone], Colonoscopy, flexible; diagnostic, including                            collection of specimen(s) by brushing or washing,                            when performed (separate procedure) Diagnosis Code(s):        --- Professional ---                           Z12.11, Encounter for screening for malignant  neoplasm of colon CPT copyright 2019 [employer]. All rights reserved. The codes documented in this report are preliminary and upon coder review may  be revised to meet current compliance requirements. [name]. [name], MD Norvel Richards, MD 12/24/2018 9:28:00 AM This report has been signed electronically. Number of Addenda: 0

## 2018-12-24 NOTE — H&P (Signed)
@LOGO @   Primary Care Physician:  Claretta Fraise, MD Primary Gastroenterologist:  Dr. Gala Romney  Pre-Procedure History & Physical: HPI:  Angel Chambers is a 63 y.o. male is here for a screening colonoscopy.  Attempted colonoscopy in December of last year not complete because of poor prep.  Here for first ever screening colonoscopy.  Past Medical History:  Diagnosis Date  . History of kidney stones   . Thyroid cancer [facility])     Past Surgical History:  Procedure Laterality Date  . FLEXIBLE SIGMOIDOSCOPY N/A 06/18/2018   Procedure: FLEXIBLE SIGMOIDOSCOPY;  Surgeon: Daneil Dolin, MD;  Location: [facility] ENDO SUITE;  Service: Endoscopy;  Laterality: N/A;  . THYROIDECTOMY N/A 01/14/2018   Procedure: TOTAL THYROIDECTOMY;  Surgeon: Aviva Signs, MD;  Location: [facility] ORS;  Service: General;  Laterality: N/A;  . WRIST FRACTURE SURGERY Left 2004    Prior to Admission medications   Medication Sig Start Date End Date Taking? Authorizing Provider  Cholecalciferol (VITAMIN D3) 50 MCG (2000 UT) TABS Take 2,000 Units by mouth 2 (two) times a day.    Yes [provider]  GAVILYTE-G 236 g solution Take 4,000 mLs by mouth once. 12/11/18  Yes [provider]  levothyroxine (SYNTHROID, LEVOTHROID) 137 MCG tablet Take 137 mcg by mouth daily before breakfast.  08/16/18  Yes [provider]    Allergies as of 08/28/2018  . (No Known Allergies)    Family History  Problem Relation Age of Onset  . Diabetes Mother   . Kidney disease Mother   . Heart attack Father   . Cancer Sister        hysterectomy due to cancer  . Hypertension Brother   . Heart disease Brother   . Thyroid disease Brother   . Colon cancer Neg Hx     Social History   Socioeconomic History  . Marital status: Married    Spouse name: Not on file  . Number of children: Not on file  . Years of education: Not on file  . Highest education level: Not on file  Occupational History  . Not on file  Social Needs  .  Financial resource strain: Not on file  . Food insecurity    Worry: Not on file    Inability: Not on file  . Transportation needs    Medical: Not on file    Non-medical: Not on file  Tobacco Use  . Smoking status: Never Smoker  . Smokeless tobacco: Never Used  Substance and Sexual Activity  . Alcohol use: No  . Drug use: No  . Sexual activity: Yes  Lifestyle  . Physical activity    Days per week: Not on file    Minutes per session: Not on file  . Stress: Not on file  Relationships  . Social Herbalist on phone: Not on file    Gets together: Not on file    Attends religious service: Not on file    Active member of club or organization: Not on file    Attends meetings of clubs or organizations: Not on file    Relationship status: Not on file  . Intimate partner violence    Fear of current or ex partner: Not on file    Emotionally abused: Not on file    Physically abused: Not on file    Forced sexual activity: Not on file  Other Topics Concern  . Not on file  Social History Narrative  . Not on file  Review of Systems: See HPI, otherwise negative ROS  Physical Exam: BP 139/88   Pulse 60   Temp 97.8 F (36.6 C) (Oral)   Resp 12   SpO2 98%  General:   Alert,  Well-developed, well-nourished, pleasant and cooperative in NAD Neck:  Supple; no masses or thyromegaly. Lungs:  Clear throughout to auscultation.   No wheezes, crackles, or rhonchi. No acute distress. Heart:  Regular rate and rhythm; no murmurs, clicks, rubs,  or gallops. Abdomen:  Soft, nontender and nondistended. No masses, hepatosplenomegaly or hernias noted. Normal bowel sounds, without guarding, and without rebound.    Impression/Plan: Angel Chambers is now here to undergo a screening colonoscopy.   First ever screening examination-average risk.  Risks, benefits, limitations, imponderables and alternatives regarding colonoscopy have been reviewed with the patient. Questions have been answered.  All parties agreeable.     Notice:  This dictation was prepared with Dragon dictation along with smaller phrase technology. Any transcriptional errors that result from this process are unintentional and may not be corrected upon review.

## 2018-12-27 ENCOUNTER — Encounter (HOSPITAL_COMMUNITY): Payer: Self-pay | Admitting: Internal Medicine

## 2019-05-02 ENCOUNTER — Telehealth: Payer: Self-pay | Admitting: Family Medicine

## 2019-05-05 ENCOUNTER — Ambulatory Visit (INDEPENDENT_AMBULATORY_CARE_PROVIDER_SITE_OTHER): Payer: BC Managed Care – PPO | Admitting: Family Medicine

## 2019-05-05 ENCOUNTER — Other Ambulatory Visit: Payer: Self-pay

## 2019-05-05 ENCOUNTER — Encounter: Payer: Self-pay | Admitting: Family Medicine

## 2019-05-05 VITALS — BP 122/81 | HR 73 | Temp 98.9°F | Resp 20 | Ht 72.0 in | Wt 195.0 lb

## 2019-05-05 DIAGNOSIS — E038 Other specified hypothyroidism: Secondary | ICD-10-CM

## 2019-05-05 NOTE — Progress Notes (Signed)
Subjective:  Patient ID: Angel Chambers, male    DOB: 04-02-1956  Age: 63 y.o. MRN: [id number]  CC: Medical Management of Chronic Issues   HPI LONAS CALVER presents for  follow-up on  thyroid. The patient has a history of thyroidectomy about 1.5 years ago. It has been stable recently. Pt. denies any change in  voice, loss of hair, heat or cold intolerance. Energy level has been adequate to good. Patient denies constipation and diarrhea. No myxedema.He has not had palpitations.  Medication is as noted below. Verified that pt is taking it daily on an empty stomach. Well tolerated.   Depression screen [facility] 2/9 05/05/2019 10/31/2018 05/31/2018  Decreased Interest 0 0 0  Down, Depressed, Hopeless 0 0 0  PHQ - 2 Score 0 0 0    History President has a past medical history of History of kidney stones and Thyroid cancer ([place]).   He has a past surgical history that includes Wrist fracture surgery (Left, 2004); Thyroidectomy (N/A, 01/14/2018); Flexible sigmoidoscopy (N/A, 06/18/2018); and Colonoscopy (N/A, 12/24/2018).   His family history includes Cancer in his sister; Diabetes in his mother; Heart attack in his father; Heart disease in his brother; Hypertension in his brother; Kidney disease in his mother; Thyroid disease in his brother.He reports that he has never smoked. He has never used smokeless tobacco. He reports that he does not drink alcohol or use drugs.    ROS Review of Systems  Constitutional: Negative for fever.  Respiratory: Negative for shortness of breath.   Cardiovascular: Negative for chest pain.  Musculoskeletal: Negative for arthralgias.  Skin: Negative for rash.    Objective:  BP 122/81   Pulse 73   Temp 98.9 F (37.2 C) (Temporal)   Resp 20   Ht 6' (1.829 m)   Wt 195 lb (88.5 kg)   SpO2 98%   BMI 26.45 kg/m   BP Readings from Last 3 Encounters:  05/05/19 122/81  12/24/18 123/83  10/31/18 122/71    Wt Readings from Last 3 Encounters:  05/05/19 195 lb (88.5 kg)   10/31/18 192 lb 12.8 oz (87.5 kg)  08/28/18 196 lb 3.2 oz (89 kg)     Physical Exam Vitals signs reviewed.  Constitutional:      General: He is not in acute distress.    Appearance: He is well-developed. He is not toxic-appearing.  HENT:     Head: Normocephalic and atraumatic.     Right Ear: External ear normal.     Left Ear: External ear normal.     Mouth/Throat:     Pharynx: No oropharyngeal exudate or posterior oropharyngeal erythema.  Eyes:     Pupils: Pupils are equal, round, and reactive to light.  Neck:     Musculoskeletal: Normal range of motion and neck supple. No neck rigidity or muscular tenderness.     Thyroid: No thyroid mass or thyromegaly.  Cardiovascular:     Rate and Rhythm: Normal rate and regular rhythm.     Heart sounds: No murmur.  Pulmonary:     Effort: No respiratory distress.     Breath sounds: Normal breath sounds.  Neurological:     Mental Status: He is alert and oriented to person, place, and time.       Assessment & Plan:   [place] was seen today for medical management of chronic issues.  Diagnoses and all orders for this visit:  Other specified hypothyroidism -     TSH + free T4  I have discontinued [name] T. Deltoro's GaviLyte-G. I am also having him maintain his levothyroxine and Vitamin D3.  Allergies as of 05/05/2019   No Known Allergies     Medication List       Accurate as of May 05, 2019  4:48 PM. If you have any questions, ask your nurse or doctor.        STOP taking these medications   GaviLyte-G 236 g solution Generic drug: polyethylene glycol Stopped by: Claretta Fraise, MD     TAKE these medications   levothyroxine 137 MCG tablet Commonly known as: SYNTHROID Take 137 mcg by mouth daily before breakfast.   Vitamin D3 50 MCG (2000 UT) Tabs Take 2,000 Units by mouth 2 (two) times a day.        Follow-up: Return in about 6 months (around 11/02/2019).  Claretta Fraise, M.D.

## 2019-05-06 LAB — TSH+FREE T4
Free T4: 1.54 ng/dL (ref 0.82–1.77)
TSH: 0.038 u[IU]/mL — ABNORMAL LOW (ref 0.450–4.500)

## 2019-05-13 DIAGNOSIS — C73 Malignant neoplasm of thyroid gland: Secondary | ICD-10-CM | POA: Diagnosis not present

## 2019-05-13 DIAGNOSIS — E890 Postprocedural hypothyroidism: Secondary | ICD-10-CM | POA: Diagnosis not present

## 2019-08-26 IMAGING — DX DG CHEST 2V
2 series · 2 of 2 positions shown · non-contrast
Comparison: 10/02/2011

CLINICAL DATA: Preoperative evaluation for upcoming thyroidectomy

EXAM:
CHEST - 2 VIEW

[chest pa]
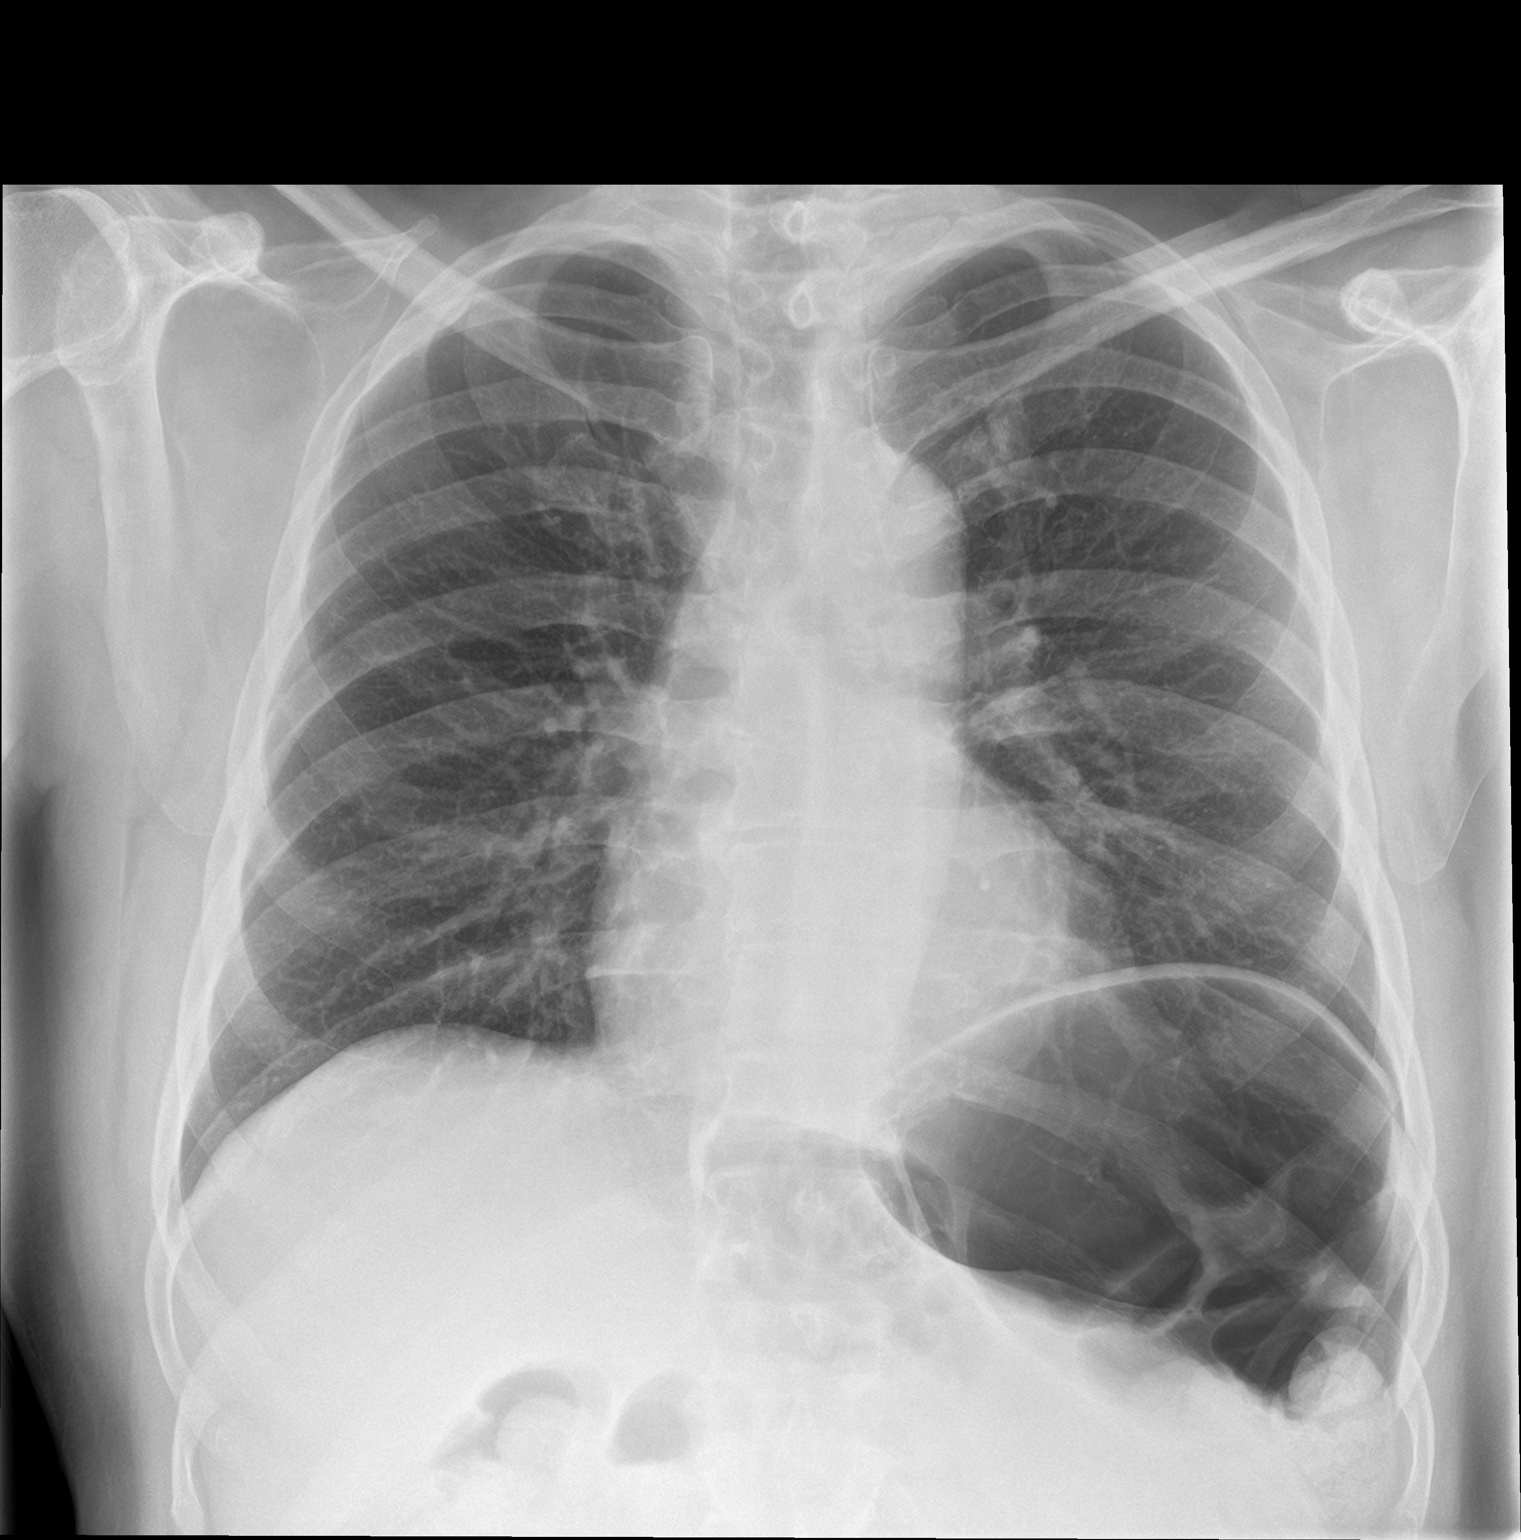

[chest lat]
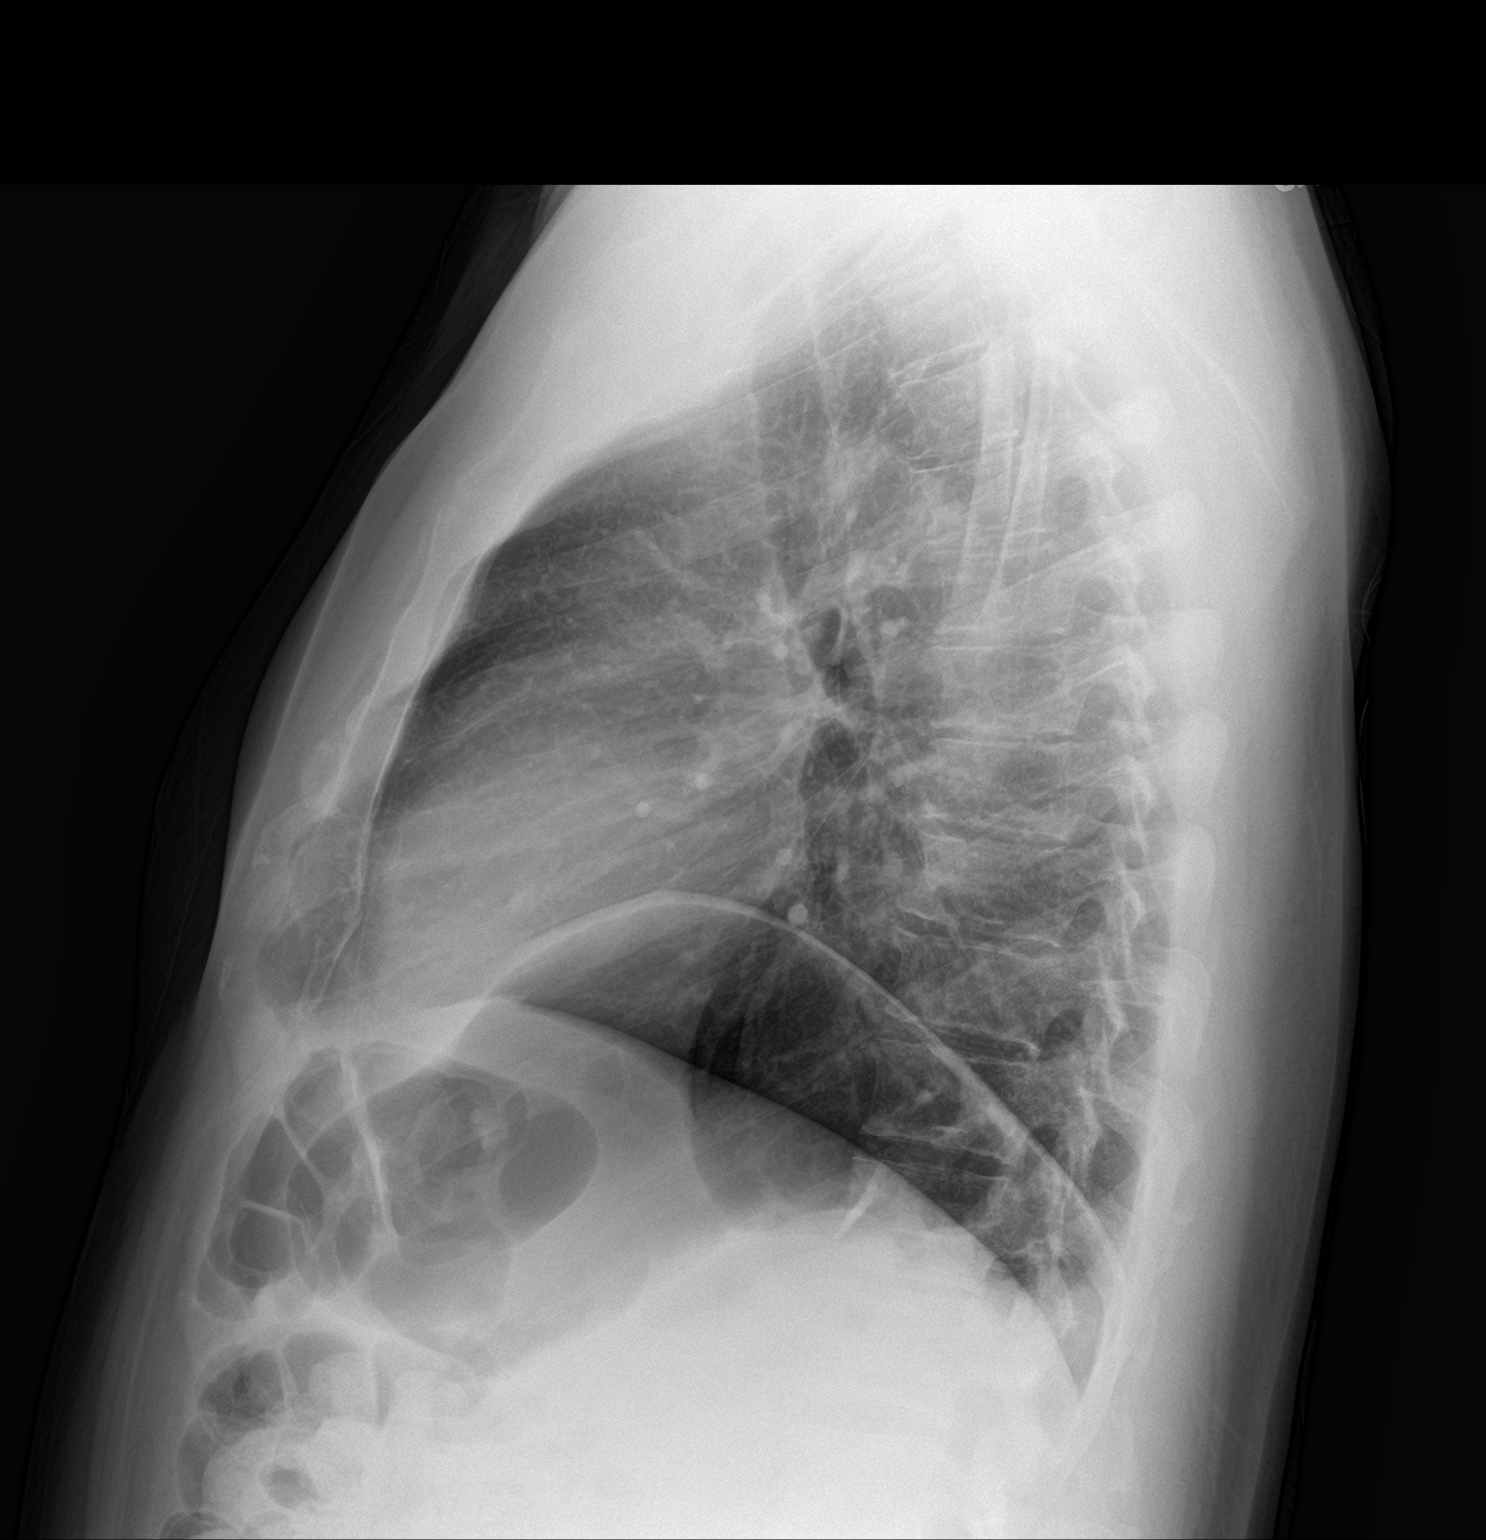

[2 of 2 positions shown; findings below may reference images not displayed]

FINDINGS: The heart size and mediastinal contours are within normal limits.
Both lungs are clear. The visualized skeletal structures are
unremarkable.
IMPRESSION: No active cardiopulmonary disease.

## 2019-10-23 ENCOUNTER — Encounter: Payer: Self-pay | Admitting: Family Medicine

## 2019-10-23 ENCOUNTER — Ambulatory Visit (INDEPENDENT_AMBULATORY_CARE_PROVIDER_SITE_OTHER): Payer: BC Managed Care – PPO | Admitting: Family Medicine

## 2019-10-23 ENCOUNTER — Other Ambulatory Visit: Payer: Self-pay

## 2019-10-23 VITALS — BP 124/80 | HR 82 | Temp 97.6°F | Ht 72.0 in | Wt 198.2 lb

## 2019-10-23 DIAGNOSIS — Z024 Encounter for examination for driving license: Secondary | ICD-10-CM

## 2019-10-23 DIAGNOSIS — Z0289 Encounter for other administrative examinations: Secondary | ICD-10-CM

## 2019-10-23 LAB — URINALYSIS
Bilirubin, UA: NEGATIVE
Glucose, UA: NEGATIVE
Leukocytes,UA: NEGATIVE
Nitrite, UA: NEGATIVE
Protein,UA: NEGATIVE
RBC, UA: NEGATIVE
Specific Gravity, UA: 1.025 (ref 1.005–1.030)
Urobilinogen, Ur: 4 mg/dL — ABNORMAL HIGH (ref 0.2–1.0)
pH, UA: 7 (ref 5.0–7.5)

## 2019-10-23 NOTE — Progress Notes (Signed)
Subjective:  Patient ID: Angel Chambers, male    DOB: 1955/11/17  Age: 64 y.o. MRN: [id number]  CC: DOT Physical   HPI Angel Chambers presents for DOT evaluation  Depression screen [facility] 2/9 05/05/2019 10/31/2018 05/31/2018  Decreased Interest 0 0 0  Down, Depressed, Hopeless 0 0 0  PHQ - 2 Score 0 0 0    History Angel Chambers has a past medical history of History of kidney stones and Thyroid cancer ([place]).   He has a past surgical history that includes Wrist fracture surgery (Left, 2004); Thyroidectomy (N/A, 01/14/2018); Flexible sigmoidoscopy (N/A, 06/18/2018); and Colonoscopy (N/A, 12/24/2018).   His family history includes Cancer in his sister; Diabetes in his mother; Heart attack in his father; Heart disease in his brother; Hypertension in his brother; Kidney disease in his mother; Thyroid disease in his brother.He reports that he has never smoked. He has never used smokeless tobacco. He reports that he does not drink alcohol or use drugs.    ROS Review of Systems  Constitutional: Negative.   HENT: Negative.   Eyes: Negative for visual disturbance.  Respiratory: Negative for cough and shortness of breath.   Cardiovascular: Negative for chest pain and leg swelling.  Gastrointestinal: Negative for abdominal pain, diarrhea, nausea and vomiting.  Genitourinary: Negative for difficulty urinating.  Musculoskeletal: Negative for arthralgias and myalgias.  Skin: Negative for rash.  Neurological: Negative for headaches.  Psychiatric/Behavioral: Negative for sleep disturbance.    Objective:  BP 124/80   Pulse 82   Temp 97.6 F (36.4 C) (Temporal)   Ht 6' (1.829 m)   Wt 198 lb 3.2 oz (89.9 kg)   BMI 26.88 kg/m   BP Readings from Last 3 Encounters:  10/23/19 124/80  05/05/19 122/81  12/24/18 123/83    Wt Readings from Last 3 Encounters:  10/23/19 198 lb 3.2 oz (89.9 kg)  05/05/19 195 lb (88.5 kg)  10/31/18 192 lb 12.8 oz (87.5 kg)     Physical Exam Constitutional:      General:  He is not in acute distress.    Appearance: He is well-developed.  HENT:     Head: Normocephalic and atraumatic.     Right Ear: External ear normal.     Left Ear: External ear normal.     Nose: Nose normal.  Eyes:     Conjunctiva/sclera: Conjunctivae normal.     Pupils: Pupils are equal, round, and reactive to light.  Cardiovascular:     Rate and Rhythm: Normal rate and regular rhythm.     Heart sounds: Normal heart sounds. No murmur.  Pulmonary:     Effort: Pulmonary effort is normal. No respiratory distress.     Breath sounds: Normal breath sounds. No wheezing or rales.  Abdominal:     Palpations: Abdomen is soft.     Tenderness: There is no abdominal tenderness.  Musculoskeletal:        General: Normal range of motion.     Cervical back: Normal range of motion and neck supple.  Skin:    General: Skin is warm and dry.  Neurological:     Mental Status: He is alert and oriented to person, place, and time.     Deep Tendon Reflexes: Reflexes are normal and symmetric.  Psychiatric:        Behavior: Behavior normal.        Thought Content: Thought content normal.        Judgment: Judgment normal.       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  Diagnoses and all orders for this visit:  Encounter for examination required by Department of Transportation (DOT) -     Urinalysis    I am having Angel Chambers maintain his levothyroxine and Vitamin D3.  Allergies as of 10/23/2019   No Known Allergies     Medication List       Accurate as of Oct 23, 2019  2:58 PM. If you have any questions, ask your nurse or doctor.        levothyroxine 137 MCG tablet Commonly known as: SYNTHROID Take 137 mcg by mouth daily before breakfast.   Vitamin D3 50 MCG (2000 UT) Tabs Take 2,000 Units by mouth 2 (two) times a day.        Follow-up: Return in about 2 years (around 10/22/2021).  Claretta Fraise, M.D.

## 2019-11-03 ENCOUNTER — Ambulatory Visit: Payer: BC Managed Care – PPO | Admitting: Family Medicine

## 2019-11-03 ENCOUNTER — Encounter: Payer: Self-pay | Admitting: Family Medicine

## 2019-11-03 ENCOUNTER — Other Ambulatory Visit: Payer: Self-pay

## 2019-11-03 VITALS — BP 120/81 | HR 80 | Temp 97.8°F | Ht 72.0 in | Wt 195.6 lb

## 2019-11-03 DIAGNOSIS — E038 Other specified hypothyroidism: Secondary | ICD-10-CM | POA: Diagnosis not present

## 2019-11-03 MED ORDER — LEVOTHYROXINE SODIUM 137 MCG PO TABS: 137.0000 ug | ORAL_TABLET | Freq: Every day | ORAL | 3 refills | Status: DC

## 2019-11-03 NOTE — Progress Notes (Signed)
Subjective:  Patient ID: Angel Chambers, male    DOB: 01/28/1956  Age: 64 y.o. MRN: [id number]  CC: Follow-up (6 month, Thyroid)   HPI Angel Chambers presents for  follow-up on  thyroid. The patient has a history of hypothyroidism for many years. It has been stable recently. Pt. denies any change in  voice, loss of hair, heat or cold intolerance. Energy level has been adequate to good. Patient denies constipation and diarrhea. No myxedema. Medication is as noted below. Verified that pt is taking it daily on an empty stomach. Well tolerated.   Depression screen [facility] 2/9 11/03/2019 05/05/2019 10/31/2018  Decreased Interest 0 0 0  Down, Depressed, Hopeless 0 0 0  PHQ - 2 Score 0 0 0    History Angel Chambers has a past medical history of History of kidney stones and Thyroid cancer ([place]).   Angel Chambers has a past surgical history that includes Wrist fracture surgery (Left, 2004); Thyroidectomy (N/A, 01/14/2018); Flexible sigmoidoscopy (N/A, 06/18/2018); and Colonoscopy (N/A, 12/24/2018).   His family history includes Cancer in his sister; Diabetes in his mother; Heart attack in his father; Heart disease in his brother; Hypertension in his brother; Kidney disease in his mother; Thyroid disease in his brother.Angel Chambers reports that Angel Chambers has never smoked. Angel Chambers has never used smokeless tobacco. Angel Chambers reports that Angel Chambers does not drink alcohol or use drugs.    ROS Review of Systems  Constitutional: Negative for fever.  Respiratory: Negative for shortness of breath.   Cardiovascular: Negative for chest pain.  Musculoskeletal: Negative for arthralgias.  Skin: Negative for rash.    Objective:  BP 120/81   Pulse 80   Temp 97.8 F (36.6 C) (Temporal)   Ht 6' (1.829 m)   Wt 195 lb 9.6 oz (88.7 kg)   BMI 26.53 kg/m   BP Readings from Last 3 Encounters:  11/03/19 120/81  10/23/19 124/80  05/05/19 122/81    Wt Readings from Last 3 Encounters:  11/03/19 195 lb 9.6 oz (88.7 kg)  10/23/19 198 lb 3.2 oz (89.9 kg)  05/05/19 195 lb  (88.5 kg)     Physical Exam Vitals reviewed.  Constitutional:      Appearance: Angel Chambers is well-developed.  HENT:     Head: Normocephalic and atraumatic.     Right Ear: External ear normal.     Left Ear: External ear normal.     Mouth/Throat:     Pharynx: No oropharyngeal exudate or posterior oropharyngeal erythema.  Eyes:     Pupils: Pupils are equal, round, and reactive to light.  Neck:     Comments: Thyroid nonpalpable (surgically absent.  Cardiovascular:     Rate and Rhythm: Normal rate and regular rhythm.     Heart sounds: No murmur.  Pulmonary:     Effort: No respiratory distress.     Breath sounds: Normal breath sounds.  Musculoskeletal:     Cervical back: Normal range of motion and neck supple. No tenderness.  Neurological:     Mental Status: Angel Chambers is alert and oriented to person, place, and time.       Assessment & Plan:   Angel Chambers was seen today for follow-up.  Diagnoses and all orders for this visit:  Other specified hypothyroidism -     CBC with Differential/Platelet -     CMP14+EGFR -     Lipid panel -     TSH + free T4  Other orders -     levothyroxine (SYNTHROID) 137 MCG tablet; Take 1  tablet (137 mcg total) by mouth daily before breakfast.       I have changed [name]'s levothyroxine. I am also having him maintain his Vitamin D3.  Allergies as of 11/03/2019   No Known Allergies     Medication List       Accurate as of Nov 03, 2019  4:51 PM. If you have any questions, ask your nurse or doctor.        levothyroxine 137 MCG tablet Commonly known as: SYNTHROID Take 1 tablet (137 mcg total) by mouth daily before breakfast.   Vitamin D3 50 MCG (2000 UT) Tabs Take 2,000 Units by mouth 2 (two) times a day.        Follow-up: Return in about 1 year (around 11/02/2020).  Claretta Fraise, M.D.

## 2019-11-04 LAB — CBC WITH DIFFERENTIAL/PLATELET
Basophils Absolute: 0 10*3/uL (ref 0.0–0.2)
Basos: 1 %
EOS (ABSOLUTE): 0.1 10*3/uL (ref 0.0–0.4)
Eos: 1 %
Hematocrit: 47.6 % (ref 37.5–51.0)
Hemoglobin: 15.8 g/dL (ref 13.0–17.7)
Immature Grans (Abs): 0 10*3/uL (ref 0.0–0.1)
Immature Granulocytes: 0 %
Lymphocytes Absolute: 1.7 10*3/uL (ref 0.7–3.1)
Lymphs: 29 %
MCH: 31.7 pg (ref 26.6–33.0)
MCHC: 33.2 g/dL (ref 31.5–35.7)
MCV: 95 fL (ref 79–97)
Monocytes Absolute: 0.5 10*3/uL (ref 0.1–0.9)
Monocytes: 8 %
Neutrophils Absolute: 3.6 10*3/uL (ref 1.4–7.0)
Neutrophils: 61 %
Platelets: 223 10*3/uL (ref 150–450)
RBC: 4.99 x10E6/uL (ref 4.14–5.80)
RDW: 12.8 % (ref 11.6–15.4)
WBC: 5.8 10*3/uL (ref 3.4–10.8)

## 2019-11-04 LAB — LIPID PANEL
Chol/HDL Ratio: 4.2 ratio (ref 0.0–5.0)
Cholesterol, Total: 218 mg/dL — ABNORMAL HIGH (ref 100–199)
HDL: 52 mg/dL (ref >39)
LDL Chol Calc (NIH): 129 mg/dL — ABNORMAL HIGH (ref 0–99)
Triglycerides: 211 mg/dL — ABNORMAL HIGH (ref 0–149)
VLDL Cholesterol Cal: 37 mg/dL (ref 5–40)

## 2019-11-04 LAB — CMP14+EGFR
ALT: 18 IU/L (ref 0–44)
AST: 16 IU/L (ref 0–40)
Albumin/Globulin Ratio: 1.4 (ref 1.2–2.2)
Albumin: 4.1 g/dL (ref 3.8–4.8)
Alkaline Phosphatase: 93 IU/L (ref 48–121)
BUN/Creatinine Ratio: 14 (ref 10–24)
BUN: 15 mg/dL (ref 8–27)
Bilirubin Total: 0.4 mg/dL (ref 0.0–1.2)
CO2: 26 mmol/L (ref 20–29)
Calcium: 9.8 mg/dL (ref 8.6–10.2)
Chloride: 104 mmol/L (ref 96–106)
Creatinine, Ser: 1.05 mg/dL (ref 0.76–1.27)
GFR calc Af Amer: 88 mL/min/{1.73_m2} (ref >59)
GFR calc non Af Amer: 76 mL/min/{1.73_m2} (ref >59)
Globulin, Total: 3 g/dL (ref 1.5–4.5)
Glucose: 102 mg/dL — ABNORMAL HIGH (ref 65–99)
Potassium: 5.3 mmol/L — ABNORMAL HIGH (ref 3.5–5.2)
Sodium: 141 mmol/L (ref 134–144)
Total Protein: 7.1 g/dL (ref 6.0–8.5)

## 2019-11-04 LAB — TSH+FREE T4
Free T4: 1.52 ng/dL (ref 0.82–1.77)
TSH: 0.094 u[IU]/mL — ABNORMAL LOW (ref 0.450–4.500)

## 2019-11-11 DIAGNOSIS — C73 Malignant neoplasm of thyroid gland: Secondary | ICD-10-CM | POA: Diagnosis not present

## 2019-11-11 DIAGNOSIS — E890 Postprocedural hypothyroidism: Secondary | ICD-10-CM | POA: Diagnosis not present

## 2020-05-11 DIAGNOSIS — C73 Malignant neoplasm of thyroid gland: Secondary | ICD-10-CM | POA: Diagnosis not present

## 2020-05-11 DIAGNOSIS — E890 Postprocedural hypothyroidism: Secondary | ICD-10-CM | POA: Diagnosis not present

## 2020-05-17 ENCOUNTER — Emergency Department (HOSPITAL_COMMUNITY): Payer: BC Managed Care – PPO

## 2020-05-17 ENCOUNTER — Encounter (HOSPITAL_COMMUNITY): Payer: Self-pay

## 2020-05-17 ENCOUNTER — Other Ambulatory Visit: Payer: Self-pay

## 2020-05-17 ENCOUNTER — Emergency Department (HOSPITAL_COMMUNITY)
Admission: EM | Admit: 2020-05-17 | Discharge: 2020-05-17 | Disposition: A | Discharge status: Home / Self Care | Payer: BC Managed Care – PPO | Attending: Emergency Medicine | Admitting: Emergency Medicine

## 2020-05-17 DIAGNOSIS — S0990XA Unspecified injury of head, initial encounter: Secondary | ICD-10-CM | POA: Diagnosis not present

## 2020-05-17 DIAGNOSIS — M25512 Pain in left shoulder: Secondary | ICD-10-CM | POA: Diagnosis not present

## 2020-05-17 DIAGNOSIS — Z79899 Other long term (current) drug therapy: Secondary | ICD-10-CM | POA: Diagnosis not present

## 2020-05-17 DIAGNOSIS — Z041 Encounter for examination and observation following transport accident: Secondary | ICD-10-CM | POA: Diagnosis not present

## 2020-05-17 DIAGNOSIS — S299XXA Unspecified injury of thorax, initial encounter: Secondary | ICD-10-CM | POA: Insufficient documentation

## 2020-05-17 DIAGNOSIS — S0091XA Abrasion of unspecified part of head, initial encounter: Secondary | ICD-10-CM | POA: Diagnosis not present

## 2020-05-17 DIAGNOSIS — R030 Elevated blood-pressure reading, without diagnosis of hypertension: Secondary | ICD-10-CM | POA: Diagnosis not present

## 2020-05-17 DIAGNOSIS — Y92410 Unspecified street and highway as the place of occurrence of the external cause: Secondary | ICD-10-CM | POA: Insufficient documentation

## 2020-05-17 DIAGNOSIS — E038 Other specified hypothyroidism: Secondary | ICD-10-CM | POA: Insufficient documentation

## 2020-05-17 DIAGNOSIS — S0181XA Laceration without foreign body of other part of head, initial encounter: Secondary | ICD-10-CM | POA: Diagnosis not present

## 2020-05-17 DIAGNOSIS — R102 Pelvic and perineal pain: Secondary | ICD-10-CM | POA: Diagnosis not present

## 2020-05-17 DIAGNOSIS — S3991XA Unspecified injury of abdomen, initial encounter: Secondary | ICD-10-CM | POA: Diagnosis not present

## 2020-05-17 DIAGNOSIS — T148XXA Other injury of unspecified body region, initial encounter: Secondary | ICD-10-CM

## 2020-05-17 DIAGNOSIS — R079 Chest pain, unspecified: Secondary | ICD-10-CM | POA: Diagnosis not present

## 2020-05-17 LAB — CBC
HCT: 46.6 % (ref 39.0–52.0)
Hemoglobin: 15.7 g/dL (ref 13.0–17.0)
MCH: 31.9 pg (ref 26.0–34.0)
MCHC: 33.7 g/dL (ref 30.0–36.0)
MCV: 94.7 fL (ref 80.0–100.0)
Platelets: 203 10*3/uL (ref 150–400)
RBC: 4.92 MIL/uL (ref 4.22–5.81)
RDW: 12.6 % (ref 11.5–15.5)
WBC: 8.6 10*3/uL (ref 4.0–10.5)
nRBC: 0 % (ref 0.0–0.2)

## 2020-05-17 LAB — HEPATIC FUNCTION PANEL
ALT: 21 U/L (ref 0–44)
AST: 19 U/L (ref 15–41)
Albumin: 4.4 g/dL (ref 3.5–5.0)
Alkaline Phosphatase: 69 U/L (ref 38–126)
Bilirubin, Direct: 0.1 mg/dL (ref 0.0–0.2)
Indirect Bilirubin: 0.5 mg/dL (ref 0.3–0.9)
Total Bilirubin: 0.6 mg/dL (ref 0.3–1.2)
Total Protein: 7.7 g/dL (ref 6.5–8.1)

## 2020-05-17 LAB — BASIC METABOLIC PANEL
Anion gap: 7 (ref 5–15)
BUN: 17 mg/dL (ref 8–23)
CO2: 26 mmol/L (ref 22–32)
Calcium: 9.1 mg/dL (ref 8.9–10.3)
Chloride: 104 mmol/L (ref 98–111)
Creatinine, Ser: 0.88 mg/dL (ref 0.61–1.24)
GFR, Estimated: >60 mL/min (ref >60)
Glucose, Bld: 107 mg/dL — ABNORMAL HIGH (ref 70–99)
Potassium: 3.9 mmol/L (ref 3.5–5.1)
Sodium: 137 mmol/L (ref 135–145)

## 2020-05-17 LAB — ETHANOL: Alcohol, Ethyl (B): <10 mg/dL (ref <10)

## 2020-05-17 LAB — LACTIC ACID, PLASMA: Lactic Acid, Venous: 0.8 mmol/L (ref 0.5–1.9)

## 2020-05-17 LAB — I-STAT CHEM 8, ED
BUN: 17 mg/dL (ref 8–23)
Calcium, Ion: 1.23 mmol/L (ref 1.15–1.40)
Chloride: 103 mmol/L (ref 98–111)
Creatinine, Ser: 0.8 mg/dL (ref 0.61–1.24)
Glucose, Bld: 107 mg/dL — ABNORMAL HIGH (ref 70–99)
HCT: 47 % (ref 39.0–52.0)
Hemoglobin: 16 g/dL (ref 13.0–17.0)
Potassium: 4.1 mmol/L (ref 3.5–5.1)
Sodium: 142 mmol/L (ref 135–145)
TCO2: 27 mmol/L (ref 22–32)

## 2020-05-17 LAB — PROTIME-INR
INR: 1 (ref 0.8–1.2)
Prothrombin Time: 13 seconds (ref 11.4–15.2)

## 2020-05-17 MED ORDER — IOHEXOL 300 MG/ML  SOLN
100.0000 mL | Freq: Once | INTRAMUSCULAR | Status: AC | PRN
  Administered 2020-05-17: 100 mL via INTRAVENOUS

## 2020-05-17 NOTE — Discharge Instructions (Addendum)
At this time there does not appear to be the presence of an emergent medical condition, however there is always the potential for conditions to change. Please read and follow the below instructions.  Please return to the Emergency Department immediately for any new or worsening symptoms. Please be sure to follow up with your Primary Care Provider within one week regarding your visit today; please call their office to schedule an appointment even if you are feeling better for a follow-up visit. Please call the orthopedist Dr. Amedeo Kinsman under discharge paperwork for further evaluation of your left shoulder pain.  You may use the sling to protect your arm from further injury.  You may use rest ice and elevation to help with pain. 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  Please discuss these incidental findings with your primary care provider at your follow-up visit. Your blood pressure was elevated during your ER visit today.  Please have your blood pressure rechecked by your primary care provider at your follow-up visit this week.  Go to the nearest Emergency Department immediately if: You have fever or chills You have: Loss of feeling (numbness), tingling, or weakness in your arms or legs. Very bad neck pain, especially tenderness in the middle of the back of your neck. A change in your ability to control your pee or poop (stool). More pain in any area of your body. Swelling in any area of your body, especially your legs. Shortness of breath or light-headedness. Chest pain. Blood in your pee, poop, or vomit. Very bad pain in your belly (abdomen) or your back. Very bad headaches or headaches that are  getting worse. Sudden vision loss or double vision. Your eye suddenly turns red. The black center of your eye (pupil) is an odd shape or size. Your arm, hand, or fingers: Tingle. Are numb. Are swollen. Are painful. Turn white or blue. You have any new/concerning or worsening of symptoms  Please read the additional information packets attached to your discharge summary.  Do not take your medicine if  develop an itchy rash, swelling in your mouth or lips, or difficulty breathing; call 911 and seek immediate emergency medical attention if this occurs.  You may review your lab tests and imaging results in their entirety on your MyChart account.  Please discuss all results of fully with your primary care provider and other specialist at your follow-up visit.  Note: Portions of this text may have been transcribed using voice recognition software. Every effort was made to ensure accuracy; however, inadvertent computerized transcription errors may still be present.
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
## 2020-05-20 ENCOUNTER — Encounter: Payer: Self-pay | Admitting: Orthopaedic Surgery

## 2020-05-20 ENCOUNTER — Other Ambulatory Visit: Payer: Self-pay

## 2020-05-20 ENCOUNTER — Ambulatory Visit: Payer: BC Managed Care – PPO | Admitting: Orthopaedic Surgery

## 2020-05-20 VITALS — BP 134/90 | HR 63 | Ht 72.0 in | Wt 197.0 lb

## 2020-05-20 DIAGNOSIS — M25512 Pain in left shoulder: Secondary | ICD-10-CM | POA: Diagnosis not present

## 2020-05-20 DIAGNOSIS — M542 Cervicalgia: Secondary | ICD-10-CM
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
## 2020-05-27 ENCOUNTER — Other Ambulatory Visit: Payer: Self-pay

## 2020-05-27 ENCOUNTER — Ambulatory Visit: Payer: BC Managed Care – PPO | Admitting: Orthopaedic Surgery

## 2020-05-27 ENCOUNTER — Encounter: Payer: Self-pay | Admitting: Orthopaedic Surgery

## 2020-05-27 VITALS — BP 155/92 | HR 68 | Ht 72.0 in | Wt 199.4 lb

## 2020-05-27 DIAGNOSIS — M25512 Pain in left shoulder: Secondary | ICD-10-CM | POA: Diagnosis not present

## 2020-05-27 DIAGNOSIS — M542 Cervicalgia: Secondary | ICD-10-CM

## 2020-05-27 NOTE — Progress Notes (Signed)
I am much better  He has full ROM of his left shoulder and neck.  He has no pain.  NV intact.  Encounter Diagnoses  Name Primary?  . Acute pain of left shoulder Yes  . Cervicalgia    Discharge.  Call if any problem.  Precautions discussed.   Electronically Signed Sanjuana Kava, MD 12/9/202111:08 AM

## 2020-10-25 ENCOUNTER — Other Ambulatory Visit: Payer: Self-pay

## 2020-10-25 ENCOUNTER — Ambulatory Visit (INDEPENDENT_AMBULATORY_CARE_PROVIDER_SITE_OTHER): Payer: 59 | Admitting: Family Medicine

## 2020-10-25 ENCOUNTER — Encounter: Payer: Self-pay | Admitting: Family Medicine

## 2020-10-25 VITALS — BP 117/71 | HR 89 | Temp 97.9°F | Ht 72.0 in | Wt 193.6 lb

## 2020-10-25 DIAGNOSIS — E038 Other specified hypothyroidism: Secondary | ICD-10-CM | POA: Diagnosis not present

## 2020-10-25 DIAGNOSIS — E559 Vitamin D deficiency, unspecified: Secondary | ICD-10-CM

## 2020-10-25 DIAGNOSIS — Z125 Encounter for screening for malignant neoplasm of prostate: Secondary | ICD-10-CM

## 2020-10-25 DIAGNOSIS — Z0001 Encounter for general adult medical examination with abnormal findings: Secondary | ICD-10-CM | POA: Diagnosis not present

## 2020-10-25 DIAGNOSIS — Z0000 Encounter for general adult medical examination without abnormal findings: Secondary | ICD-10-CM

## 2020-10-25 LAB — URINALYSIS
Bilirubin, UA: NEGATIVE
Glucose, UA: NEGATIVE
Leukocytes,UA: NEGATIVE
Nitrite, UA: NEGATIVE
RBC, UA: NEGATIVE
Specific Gravity, UA: >1.03 — ABNORMAL HIGH (ref 1.005–1.030)
Urobilinogen, Ur: 2 mg/dL — ABNORMAL HIGH (ref 0.2–1.0)
pH, UA: 6 (ref 5.0–7.5)

## 2020-10-25 NOTE — Progress Notes (Signed)
Subjective:  Patient ID: Angel Chambers, male    DOB: 05-Jul-1955  Age: 65 y.o. MRN: [id number]  CC: Annual Exam   HPI Angel Chambers presents for CPE  follow-up on  thyroid. The patient has a history of hypothyroidism for many years. It has been stable recently. Pt. denies any change in  voice, loss of hair, heat or cold intolerance. Energy level has been adequate to good. Patient denies constipation and diarrhea. No myxedema. Medication is as noted below. Verified that pt is taking it daily on an empty stomach. Well tolerated.   Depression screen [facility] 2/9 10/25/2020 11/03/2019 05/05/2019  Decreased Interest 0 0 0  Down, Depressed, Hopeless 0 0 0  PHQ - 2 Score 0 0 0    History Angel Chambers has a past medical history of History of kidney stones and Thyroid cancer ([place]).   Angel Chambers has a past surgical history that includes Wrist fracture surgery (Left, 2004); Thyroidectomy (N/A, 01/14/2018); Flexible sigmoidoscopy (N/A, 06/18/2018); and Colonoscopy (N/A, 12/24/2018).   His family history includes Cancer in his sister; Diabetes in his mother; Heart attack in his father; Heart disease in his brother; Hypertension in his brother; Kidney disease in his mother; Thyroid disease in his brother.Angel Chambers reports that Angel Chambers has never smoked. Angel Chambers has never used smokeless tobacco. Angel Chambers reports that Angel Chambers does not drink alcohol and does not use drugs.    ROS Review of Systems  Constitutional: Negative for activity change, fatigue and unexpected weight change.  HENT: Negative for congestion, ear pain, hearing loss, postnasal drip and trouble swallowing.   Eyes: Negative for pain and visual disturbance.  Respiratory: Negative for cough, chest tightness and shortness of breath.   Cardiovascular: Negative for chest pain, palpitations and leg swelling.  Gastrointestinal: Negative for abdominal distention, abdominal pain, blood in stool, constipation, diarrhea, nausea and vomiting.  Endocrine: Negative for cold intolerance, heat  intolerance and polydipsia.  Genitourinary: Negative for difficulty urinating, dysuria, flank pain, frequency and urgency.  Musculoskeletal: Positive for myalgias (leg cramps at night a couople of times a week. relief with prn  med). Negative for arthralgias and joint swelling.  Skin: Negative for color change, rash and wound.  Neurological: Negative for dizziness, syncope, speech difficulty, weakness, light-headedness, numbness and headaches.  Hematological: Does not bruise/bleed easily.  Psychiatric/Behavioral: Negative for confusion, decreased concentration, dysphoric mood and sleep disturbance. The patient is not nervous/anxious.     Objective:  BP 117/71   Pulse 89   Temp 97.9 F (36.6 C)   Ht 6' (1.829 m)   Wt 193 lb 9.6 oz (87.8 kg)   SpO2 98%   BMI 26.26 kg/m   BP Readings from Last 3 Encounters:  10/25/20 117/71  05/27/20 (!) 155/92  05/20/20 134/90    Wt Readings from Last 3 Encounters:  10/25/20 193 lb 9.6 oz (87.8 kg)  05/27/20 199 lb 6 oz (90.4 kg)  05/20/20 197 lb (89.4 kg)     Physical Exam Constitutional:      Appearance: Angel Chambers is well-developed.  HENT:     Head: Normocephalic and atraumatic.  Eyes:     Pupils: Pupils are equal, round, and reactive to light.  Neck:     Thyroid: No thyromegaly.     Trachea: No tracheal deviation.  Cardiovascular:     Rate and Rhythm: Normal rate and regular rhythm.     Heart sounds: Normal heart sounds. No murmur heard. No friction rub. No gallop.   Pulmonary:     Breath sounds:  Normal breath sounds. No wheezing or rales.  Abdominal:     General: Bowel sounds are normal. There is no distension.     Palpations: Abdomen is soft. There is no mass.     Tenderness: There is no abdominal tenderness.     Hernia: There is no hernia in the left inguinal area.  Genitourinary:    Penis: Normal.      Testes: Normal.  Musculoskeletal:        General: Normal range of motion.     Cervical back: Normal range of motion.   Lymphadenopathy:     Cervical: No cervical adenopathy.  Skin:    General: Skin is warm and dry.  Neurological:     Mental Status: Angel Chambers is alert and oriented to person, place, and time.       Assessment & Plan:   Angel Chambers was seen today for annual exam.  Diagnoses and all orders for this visit:  Well adult exam -     CBC with Differential/Platelet -     CMP14+EGFR -     Lipid panel -     PSA, total and free -     Urinalysis -     TSH + free T4 -     VITAMIN D 25 Hydroxy (Vit-D Deficiency, Fractures)  Other specified hypothyroidism -     CBC with Differential/Platelet -     CMP14+EGFR -     Lipid panel -     TSH + free T4  Screening for prostate cancer -     PSA, total and free  Vitamin D deficiency -     VITAMIN D 25 Hydroxy (Vit-D Deficiency, Fractures)       I am having Angel Chambers maintain his Vitamin D3 and levothyroxine.  Allergies as of 10/25/2020   No Known Allergies     Medication List       Accurate as of Oct 25, 2020  2:48 PM. If you have any questions, ask your nurse or doctor.        levothyroxine 137 MCG tablet Commonly known as: SYNTHROID Take 1 tablet (137 mcg total) by mouth daily before breakfast.   Vitamin D3 50 MCG (2000 UT) Tabs Take 2,000 Units by mouth 2 (two) times a day.        Follow-up: Return in about 1 year (around 10/25/2021).  Claretta Fraise, M.D.

## 2020-10-26 ENCOUNTER — Other Ambulatory Visit: Payer: Self-pay | Admitting: Family Medicine

## 2020-10-26 DIAGNOSIS — Z0000 Encounter for general adult medical examination without abnormal findings: Secondary | ICD-10-CM

## 2020-10-26 DIAGNOSIS — R972 Elevated prostate specific antigen [PSA]: Secondary | ICD-10-CM

## 2020-10-26 DIAGNOSIS — E038 Other specified hypothyroidism: Secondary | ICD-10-CM

## 2020-10-26 LAB — PSA, TOTAL AND FREE
PSA, Free Pct: 25.2 %
PSA, Free: 1.46 ng/mL
Prostate Specific Ag, Serum: 5.8 ng/mL — ABNORMAL HIGH (ref 0.0–4.0)

## 2020-10-26 LAB — CMP14+EGFR
ALT: 14 IU/L (ref 0–44)
AST: 15 IU/L (ref 0–40)
Albumin/Globulin Ratio: 1.9 (ref 1.2–2.2)
Albumin: 4.6 g/dL (ref 3.8–4.8)
Alkaline Phosphatase: 80 IU/L (ref 44–121)
BUN/Creatinine Ratio: 15 (ref 10–24)
BUN: 14 mg/dL (ref 8–27)
Bilirubin Total: 0.6 mg/dL (ref 0.0–1.2)
CO2: 23 mmol/L (ref 20–29)
Calcium: 9.3 mg/dL (ref 8.6–10.2)
Chloride: 108 mmol/L — ABNORMAL HIGH (ref 96–106)
Creatinine, Ser: 0.92 mg/dL (ref 0.76–1.27)
Globulin, Total: 2.4 g/dL (ref 1.5–4.5)
Glucose: 109 mg/dL — ABNORMAL HIGH (ref 65–99)
Potassium: 3.8 mmol/L (ref 3.5–5.2)
Sodium: 144 mmol/L (ref 134–144)
Total Protein: 7 g/dL (ref 6.0–8.5)
eGFR: 93 mL/min/{1.73_m2} (ref >59)

## 2020-10-26 LAB — CBC WITH DIFFERENTIAL/PLATELET
Basophils Absolute: 0 10*3/uL (ref 0.0–0.2)
Basos: 0 %
EOS (ABSOLUTE): 0.1 10*3/uL (ref 0.0–0.4)
Eos: 1 %
Hematocrit: 46.4 % (ref 37.5–51.0)
Hemoglobin: 15.6 g/dL (ref 13.0–17.7)
Immature Grans (Abs): 0 10*3/uL (ref 0.0–0.1)
Immature Granulocytes: 0 %
Lymphocytes Absolute: 1.5 10*3/uL (ref 0.7–3.1)
Lymphs: 28 %
MCH: 31.8 pg (ref 26.6–33.0)
MCHC: 33.6 g/dL (ref 31.5–35.7)
MCV: 95 fL (ref 79–97)
Monocytes Absolute: 0.5 10*3/uL (ref 0.1–0.9)
Monocytes: 10 %
Neutrophils Absolute: 3.3 10*3/uL (ref 1.4–7.0)
Neutrophils: 61 %
Platelets: 218 10*3/uL (ref 150–450)
RBC: 4.9 x10E6/uL (ref 4.14–5.80)
RDW: 12.9 % (ref 11.6–15.4)
WBC: 5.5 10*3/uL (ref 3.4–10.8)

## 2020-10-26 LAB — LIPID PANEL
Chol/HDL Ratio: 3.9 ratio (ref 0.0–5.0)
Cholesterol, Total: 211 mg/dL — ABNORMAL HIGH (ref 100–199)
HDL: 54 mg/dL (ref >39)
LDL Chol Calc (NIH): 135 mg/dL — ABNORMAL HIGH (ref 0–99)
Triglycerides: 121 mg/dL (ref 0–149)
VLDL Cholesterol Cal: 22 mg/dL (ref 5–40)

## 2020-10-26 LAB — TSH+FREE T4
Free T4: 1.71 ng/dL (ref 0.82–1.77)
TSH: 0.414 u[IU]/mL — ABNORMAL LOW (ref 0.450–4.500)

## 2020-10-26 LAB — VITAMIN D 25 HYDROXY (VIT D DEFICIENCY, FRACTURES): Vit D, 25-Hydroxy: 54.6 ng/mL (ref 30.0–100.0)

## 2020-11-08 ENCOUNTER — Other Ambulatory Visit: Payer: Self-pay | Admitting: Family Medicine

## 2020-12-06 ENCOUNTER — Other Ambulatory Visit: Payer: Self-pay

## 2020-12-06 ENCOUNTER — Ambulatory Visit (INDEPENDENT_AMBULATORY_CARE_PROVIDER_SITE_OTHER): Payer: 59 | Admitting: Urology

## 2020-12-06 VITALS — BP 142/86 | HR 64 | Ht 72.0 in | Wt 195.0 lb

## 2020-12-06 DIAGNOSIS — N138 Other obstructive and reflux uropathy: Secondary | ICD-10-CM

## 2020-12-06 DIAGNOSIS — R3912 Poor urinary stream: Secondary | ICD-10-CM

## 2020-12-06 DIAGNOSIS — N401 Enlarged prostate with lower urinary tract symptoms: Secondary | ICD-10-CM

## 2020-12-06 DIAGNOSIS — R972 Elevated prostate specific antigen [PSA]: Secondary | ICD-10-CM

## 2020-12-06 LAB — URINALYSIS, ROUTINE W REFLEX MICROSCOPIC
Bilirubin, UA: NEGATIVE
Glucose, UA: NEGATIVE
Ketones, UA: NEGATIVE
Leukocytes,UA: NEGATIVE
Nitrite, UA: NEGATIVE
Protein,UA: NEGATIVE
RBC, UA: NEGATIVE
Specific Gravity, UA: 1.025 (ref 1.005–1.030)
Urobilinogen, Ur: 0.2 mg/dL (ref 0.2–1.0)
pH, UA: 6 (ref 5.0–7.5)

## 2020-12-06 MED ORDER — LEVOFLOXACIN 750 MG PO TABS: 750.0000 mg | ORAL_TABLET | Freq: Once | ORAL | 0 refills | Status: AC

## 2020-12-06 NOTE — Patient Instructions (Signed)
   Appointment Time: 12:15 Please arrive by 12:00 noon Appointment Date:  01/03/21  Location: [facility] Radiology Department   Prostate Biopsy Instructions  Stop all aspirin or blood thinners (aspirin, plavix, coumadin, warfarin, motrin, ibuprofen, advil, aleve, naproxen, naprosyn) for 7 days prior to the procedure.  If you have any questions about stopping these medications, please contact your primary care physician or cardiologist.  Having a light meal prior to the procedure is recommended.  If you are diabetic or have low blood sugar please bring a small snack or glucose tablet.  A Fleets enema is needed to be purchased over the counter at a local pharmacy and used 2 hours before you scheduled appointment.  This can be purchased over the counter at any pharmacy.  Antibiotics will be administered in the clinic at the time of the procedure and 1 tablet has been sent to your pharmacy. Please take the antibiotic as prescribed.    Please bring someone with you to the procedure to drive you home if you are given a valium to take prior to your procedure.   If you have any questions or concerns, please feel free to call the office at [phone] or send a Mychart message.    Thank you, [facility]

## 2020-12-06 NOTE — Progress Notes (Signed)
12/06/2020 11:18 AM   [name] [birth year] [id number]  Referring provider: Claretta Fraise, MD [place],  [place] [id number]  PSA elevation  HPI:  New pt -   1) PSA elevation - his 05/20 PSA 3.8 and now 05/22 PSA 5.8. No prior PSA elevation or bx. No FH of PCa. No h/ o BPH. AUASS = 16. Occ . PVR 28 ml. Frequency, nocturia, weak stream. UA clear. No prostate meds or surgery.   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     PMH: Past Medical History:  Diagnosis Date   History of kidney stones    Thyroid cancer [facility])     Surgical History: Past Surgical History:  Procedure Laterality Date   COLONOSCOPY N/A 12/24/2018   Procedure: COLONOSCOPY;  Surgeon: Daneil Dolin, MD;  [facility];  Service: Endoscopy;  Laterality: N/A;  12:45pm   FLEXIBLE SIGMOIDOSCOPY N/A 06/18/2018   Procedure: FLEXIBLE SIGMOIDOSCOPY;  Surgeon: Daneil Dolin, MD;  [facility];  Service: Endoscopy;  Laterality: N/A;   THYROIDECTOMY N/A 01/14/2018   Procedure: TOTAL THYROIDECTOMY;  Surgeon: Aviva Signs, MD;  Location: [facility];  Service: General;  Laterality: N/A;   WRIST FRACTURE SURGERY Left 2004    Home Medications:  Allergies as of 12/06/2020   No Known Allergies      Medication List        Accurate as of December 06, 2020 11:18 AM. If you have any questions, ask your nurse or doctor.          levothyroxine 137 MCG tablet Commonly known as: SYNTHROID TAKE 1 TABLET BY MOUTH ONCE DAILY BEFORE BREAKFAST   Vitamin D3 50 MCG (2000 UT) Tabs Take 2,000 Units by mouth 2 (two) times a day.        Allergies: No Known Allergies  Family History: Family History  Problem Relation Age of Onset   Diabetes Mother    Kidney disease Mother    Heart attack Father    Cancer Sister        hysterectomy due to cancer   Hypertension Brother    Heart disease Brother    Thyroid disease Brother    Colon cancer Neg Hx     Social History:   reports that he has never smoked. He has never used smokeless tobacco. He reports that he does not drink alcohol and does not use drugs.   Physical Exam: There were no vitals taken for this visit.  Constitutional:  Alert and oriented, No acute distress. HEENT: [place] AT, moist mucus membranes.  Trachea midline, no masses. Cardiovascular: No clubbing, cyanosis, or edema. Respiratory: Normal respiratory effort, no increased work of breathing. GI: Abdomen is soft, nontender, nondistended, no abdominal masses GU: No CVA tenderness Lymph: No cervical or inguinal lymphadenopathy. Skin: No rashes, bruises or suspicious lesions. Neurologic: Grossly intact, no focal deficits, moving all 4 extremities. Psychiatric: Normal mood and affect. GU: Penis circumcised, normal foreskin, testicles descended bilaterally and palpably normal, bilateral epididymis palpably normal, scrotum normal DRE: Prostate 40 g, the right prostate is firm and has a nodule    Laboratory Data: Lab Results  Component Value Date   WBC 5.5 10/25/2020   HGB 15.6 10/25/2020   HCT 46.4 10/25/2020   MCV 95 10/25/2020   PLT 218 10/25/2020    Lab Results  Component Value Date   CREATININE 0.92 10/25/2020    No results found for: PSA  No  results found for: TESTOSTERONE  No results found for: HGBA1C  Urinalysis    Component Value Date/Time   APPEARANCEUR Clear 10/25/2020 1502   GLUCOSEU Negative 10/25/2020 1502   BILIRUBINUR Negative 10/25/2020 1502   PROTEINUR Trace (A) 10/25/2020 1502   UROBILINOGEN negative 11/11/2013 1641   NITRITE Negative 10/25/2020 1502   LEUKOCYTESUR Negative 10/25/2020 1502    No results found for: LABMICR, WBCUA, RBCUA, LABEPIT, MUCUS, BACTERIA  Pertinent Imaging: N/a   Assessment & Plan:    1. Elevated PSA I had a long discussion with the patient on the nature of elevated PSA - benign vs malignant causes. We discussed age specific levels and that PCa can be seen on a biopsy with  very low PSA levels (<=2.5). We discussed the nature risks and benefits of continued surveillance, other lab tests, imaging as well as prostate biopsy. We discussed the management of prostate cancer might include active surveillance or treatment depending on biopsy findings. All questions answered. PSA was sent and we will set up a prostate biopsy.    2. BPH, LUTS - consider trial of tamsulosin.    No follow-ups on file.  Festus Aloe, MD  [facility]  [address] [place], [place] [address] [phone]

## 2020-12-06 NOTE — Progress Notes (Signed)
post void residual=28  Urological Symptom Review  Patient is experiencing the following symptoms: Frequent urination Get up at night to urinate Stream starts and stops Trouble starting stream Weak stream   Review of Systems  Gastrointestinal (upper)  : Negative for upper GI symptoms  Gastrointestinal (lower) : Negative for lower GI symptoms  Constitutional : Negative for symptoms  Skin: Negative for skin symptoms  Eyes: Negative for eye symptoms  Ear/Nose/Throat : Negative for Ear/Nose/Throat symptoms  Hematologic/Lymphatic: Negative for Hematologic/Lymphatic symptoms  Cardiovascular : Negative for cardiovascular symptoms  Respiratory : Negative for respiratory symptoms  Endocrine: Negative for endocrine symptoms  Musculoskeletal: Negative for musculoskeletal symptoms  Neurological: Negative for neurological symptoms  Psychologic: Negative for psychiatric symptoms

## 2021-01-03 ENCOUNTER — Ambulatory Visit (HOSPITAL_COMMUNITY)
Admission: RE | Admit: 2021-01-03 | Discharge: 2021-01-03 | Disposition: A | Discharge status: Home / Self Care | Payer: 59 | Source: Ambulatory Visit | Attending: Urology | Admitting: Urology

## 2021-01-03 ENCOUNTER — Encounter (HOSPITAL_COMMUNITY): Payer: Self-pay

## 2021-01-03 ENCOUNTER — Ambulatory Visit (INDEPENDENT_AMBULATORY_CARE_PROVIDER_SITE_OTHER): Payer: 59 | Admitting: Urology

## 2021-01-03 ENCOUNTER — Other Ambulatory Visit: Payer: Self-pay | Admitting: Urology

## 2021-01-03 ENCOUNTER — Other Ambulatory Visit: Payer: Self-pay

## 2021-01-03 DIAGNOSIS — R972 Elevated prostate specific antigen [PSA]: Secondary | ICD-10-CM | POA: Diagnosis not present

## 2021-01-03 DIAGNOSIS — C61 Malignant neoplasm of prostate: Secondary | ICD-10-CM | POA: Diagnosis not present

## 2021-01-03 MED ORDER — LIDOCAINE HCL 2 % IJ SOLN
INTRAMUSCULAR | Status: AC
  Filled 2021-01-03: qty 10

## 2021-01-03 MED ORDER — CEFTRIAXONE SODIUM 1 G IJ SOLR: 1.0000 g | Freq: Once | INTRAMUSCULAR | Status: AC

## 2021-01-03 MED ORDER — LIDOCAINE HCL (PF) 1 % IJ SOLN
INTRAMUSCULAR | Status: AC
  Administered 2021-01-03: 2.1 mL
  Filled 2021-01-03: qty 5

## 2021-01-03 MED ORDER — LIDOCAINE HCL (PF) 2 % IJ SOLN: 10.0000 mL | Freq: Once | INTRAMUSCULAR | Status: DC

## 2021-01-03 MED ORDER — CEFTRIAXONE SODIUM 1 G IJ SOLR
INTRAMUSCULAR | Status: AC
  Administered 2021-01-03: 1 g via INTRAMUSCULAR
  Filled 2021-01-03: qty 10

## 2021-01-03 MED ORDER — GENTAMICIN SULFATE 40 MG/ML IJ SOLN: 160.0000 mg | Freq: Once | INTRAMUSCULAR | Status: DC

## 2021-01-03 MED ORDER — LIDOCAINE HCL (PF) 1 % IJ SOLN: 5.0000 mL | Freq: Once | INTRAMUSCULAR | Status: AC

## 2021-01-03 MED ORDER — GENTAMICIN SULFATE 40 MG/ML IJ SOLN
INTRAMUSCULAR | Status: AC
  Filled 2021-01-03: qty 4

## 2021-01-03 MED ORDER — LIDOCAINE HCL 2 % IJ SOLN
INTRAMUSCULAR | Status: AC
  Administered 2021-01-03: 200 mg
  Filled 2021-01-03: qty 10

## 2021-01-03 NOTE — Progress Notes (Signed)
Prostate Biopsy Procedure   Informed consent was obtained after discussing risks/benefits of the procedure.  A time out was performed to ensure correct patient identity.  Pre-Procedure: - Last PSA Level: 5.8 - rocephin IM given -DRE: more benign today with R>L asymmetry of prostate but no specific nodule   -Transrectal Ultrasound performed revealing a 65.68 g prostate (5.7 x 4.9 x 4.4 cm) gm prostate -No significant hypoechoic area noted -Small median lobe noted  Procedure: - Prostate block performed using 10 cc 1% lidocaine and biopsies taken from sextant areas, a total of 12 under ultrasound guidance.  Post-Procedure: - Patient tolerated the procedure well - He was counseled to seek immediate medical attention if experiences any severe pain, significant bleeding, or fevers - Return in one week to discuss biopsy results

## 2021-01-03 NOTE — Sedation Documentation (Signed)
PT tolerated prostate biopsy procedure and antibiotic injection well today. Labs obtained and sent for pathology. PT ambulatory at discharge with no acute distress noted and verbalized understanding of discharge instructions.  

## 2021-01-17 ENCOUNTER — Encounter: Payer: Self-pay | Admitting: Urology

## 2021-01-17 ENCOUNTER — Ambulatory Visit (INDEPENDENT_AMBULATORY_CARE_PROVIDER_SITE_OTHER): Payer: 59 | Admitting: Urology

## 2021-01-17 ENCOUNTER — Other Ambulatory Visit: Payer: Self-pay

## 2021-01-17 DIAGNOSIS — N401 Enlarged prostate with lower urinary tract symptoms: Secondary | ICD-10-CM | POA: Diagnosis not present

## 2021-01-17 DIAGNOSIS — C61 Malignant neoplasm of prostate: Secondary | ICD-10-CM | POA: Diagnosis not present

## 2021-01-17 DIAGNOSIS — R3911 Hesitancy of micturition: Secondary | ICD-10-CM

## 2021-01-17 MED ORDER — TAMSULOSIN HCL 0.4 MG PO CAPS: 0.4000 mg | ORAL_CAPSULE | Freq: Every day | ORAL | 11 refills | Status: DC

## 2021-01-17 NOTE — Progress Notes (Signed)
01/17/2021 3:20 PM   Angel Chambers June 30, 1955 [id number]  Referring provider: Claretta Fraise, MD [place],  [place] [address]  No chief complaint on file.   HPI:  1) PCa - pt was diagnosed with early intermediate risk PCa 07/22. His PSA was 05/20 PSA 3.8 and then 05/22 PSA 5.8. I thought he had a right prostate nodule on exam but at the biopsy in lateral decubitus, I got a better exam and prostate felt benign. No FH of PCa. He underwent prostate bx 01/03/2021.  July 2022 Early Intermediate risk PCa PSA 5.8 T1c (R>L asymmetry) Prostate 66 g (psad 0.09) Gleason 3+4=7 in one core, 10% (pattern 4 10%)  He felt "sweats" and "felt terrible" for about 8 hours after the bx and then it all resolved. Doing well today. No hematuria.    2) BPH - No prostate meds or surgery. Prostate 66 g on [place]. AUASS = 16. Occ . PVR 28 ml. Frequency, nocturia, weak stream. UA clear. He mentioned the LUTS again and noted they are more bothersome.     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 Not sexually active. His wife had kTx and needs another one.    PMH: Past Medical History:  Diagnosis Date   History of kidney stones    Thyroid cancer [facility])     Surgical History: Past Surgical History:  Procedure Laterality Date   COLONOSCOPY N/A 12/24/2018   Procedure: COLONOSCOPY;  Surgeon: Daneil Dolin, MD;  [facility];  Service: Endoscopy;  Laterality: N/A;  12:45pm   FLEXIBLE SIGMOIDOSCOPY N/A 06/18/2018   Procedure: FLEXIBLE SIGMOIDOSCOPY;  Surgeon: Daneil Dolin, MD;  [facility];  Service: Endoscopy;  Laterality: N/A;   THYROIDECTOMY N/A 01/14/2018   Procedure: TOTAL THYROIDECTOMY;  Surgeon: Aviva Signs, MD;  Location: [facility] ORS;  Service: General;  Laterality: N/A;   WRIST FRACTURE SURGERY Left 2004    Home Medications:  Allergies as of 01/17/2021   No Known Allergies      Medication List        Accurate as of January 17, 2021  3:20 PM. If  you have any questions, ask your nurse or doctor.          levothyroxine 137 MCG tablet Commonly known as: SYNTHROID TAKE 1 TABLET BY MOUTH ONCE DAILY BEFORE BREAKFAST   Vitamin D3 50 MCG (2000 UT) Tabs Take 2,000 Units by mouth 2 (two) times a day.        Allergies: No Known Allergies  Family History: Family History  Problem Relation Age of Onset   Diabetes Mother    Kidney disease Mother    Heart attack Father    Cancer Sister        hysterectomy due to cancer   Hypertension Brother    Heart disease Brother    Thyroid disease Brother    Colon cancer Neg Hx     Social History:  reports that he has never smoked. He has never used smokeless tobacco. He reports that he does not drink alcohol and does not use drugs.   Physical Exam: There were no vitals taken for this visit.  Constitutional:  Alert and oriented, No acute distress. HEENT: [place] AT, moist mucus membranes.  Trachea midline, no masses. Cardiovascular: No clubbing, cyanosis, or edema. Respiratory: Normal respiratory effort, no increased work of breathing. GI: Abdomen is soft, nontender, nondistended, no abdominal masses GU: No CVA tenderness Lymph: No cervical  or inguinal lymphadenopathy. Skin: No rashes, bruises or suspicious lesions. Neurologic: Grossly intact, no focal deficits, moving all 4 extremities. Psychiatric: Normal mood and affect.  Laboratory Data: Lab Results  Component Value Date   WBC 5.5 10/25/2020   HGB 15.6 10/25/2020   HCT 46.4 10/25/2020   MCV 95 10/25/2020   PLT 218 10/25/2020    Lab Results  Component Value Date   CREATININE 0.92 10/25/2020    No results found for: PSA  No results found for: TESTOSTERONE  No results found for: HGBA1C  Urinalysis    Component Value Date/Time   APPEARANCEUR Clear 12/06/2020 1132   GLUCOSEU Negative 12/06/2020 1132   BILIRUBINUR Negative 12/06/2020 1132   PROTEINUR Negative 12/06/2020 1132   UROBILINOGEN negative 11/11/2013 1641    NITRITE Negative 12/06/2020 1132   LEUKOCYTESUR Negative 12/06/2020 1132    Lab Results  Component Value Date   LABMICR Comment 12/06/2020     Assessment & Plan:    Prostate cancer - I had a long discussion with the patient using his path report as a reference and the prostate cancer poster. We went over his stage, grade and prognosis and the relevant anatomy. We discussed the nature risks and benefits of active surveillance, radical prostatectomy, IMRT (+/- brachytherapy, +/- ADT). We discussed specifically how each treatment might affect the bowel, bladder and sexual function. We discussed how each treatment might effect salvage treatments. We discussed the role of other modalities in the treatment of prostate cancer including chemotherapy, HIFU and cryotherapy. He would like to pursue active surveillance which is reasonable. His exam was more benign, very small amount of gleason 3+4 located and his PSAD is normal.   BPH - discussed the nature r/b/a to tamsulosin and he will start.    No follow-ups on file.  Festus Aloe, MD  [facility]  [address] [place], [place] [address] [phone]

## 2021-05-16 ENCOUNTER — Other Ambulatory Visit: Payer: Self-pay

## 2021-05-16 ENCOUNTER — Other Ambulatory Visit: Payer: 59

## 2021-05-17 LAB — PSA: Prostate Specific Ag, Serum: 5.6 ng/mL — ABNORMAL HIGH (ref 0.0–4.0)

## 2021-05-23 ENCOUNTER — Other Ambulatory Visit: Payer: Self-pay

## 2021-05-23 ENCOUNTER — Encounter: Payer: Self-pay | Admitting: Urology

## 2021-05-23 ENCOUNTER — Ambulatory Visit (INDEPENDENT_AMBULATORY_CARE_PROVIDER_SITE_OTHER): Payer: 59 | Admitting: Urology

## 2021-05-23 VITALS — BP 144/93 | HR 85 | Wt 200.0 lb

## 2021-05-23 DIAGNOSIS — C61 Malignant neoplasm of prostate: Secondary | ICD-10-CM | POA: Diagnosis not present

## 2021-05-23 DIAGNOSIS — R3911 Hesitancy of micturition: Secondary | ICD-10-CM

## 2021-05-23 DIAGNOSIS — N401 Enlarged prostate with lower urinary tract symptoms: Secondary | ICD-10-CM

## 2021-05-23 LAB — MICROSCOPIC EXAMINATION
Bacteria, UA: NONE SEEN
Epithelial Cells (non renal): NONE SEEN /hpf (ref 0–10)
RBC, Urine: NONE SEEN /hpf (ref 0–2)
Renal Epithel, UA: NONE SEEN /hpf
WBC, UA: NONE SEEN /hpf (ref 0–5)

## 2021-05-23 LAB — URINALYSIS, ROUTINE W REFLEX MICROSCOPIC
Bilirubin, UA: NEGATIVE
Glucose, UA: NEGATIVE
Ketones, UA: NEGATIVE
Leukocytes,UA: NEGATIVE
Nitrite, UA: NEGATIVE
Protein,UA: NEGATIVE
Specific Gravity, UA: >1.03 — ABNORMAL HIGH (ref 1.005–1.030)
Urobilinogen, Ur: 0.2 mg/dL (ref 0.2–1.0)
pH, UA: 5 (ref 5.0–7.5)

## 2021-05-23 NOTE — Progress Notes (Signed)
05/23/2021 3:00 PM   Angel Chambers July 30, 1955 [id number]  Referring provider: Claretta Fraise, MD [place],  [place] [address]  No chief complaint on file.   HPI: F/u -   1) PCa - pt was diagnosed with early intermediate risk PCa 07/22. His PSA was 05/20 PSA 3.8 and then 05/22 PSA 5.8. I thought he had a right prostate nodule on exam but at the biopsy in lateral decubitus, I got a better exam and prostate felt benign. No FH of PCa. He underwent prostate bx 01/03/2021.   Biopsy: #17 December 2020 Early Intermediate risk PCa PSA 5.8 T1c (R>L asymmetry) Prostate 66 g (psad 0.09) Gleason 3+4=7 in one core, 10% (pattern 4 10%)   His PSA remain lower/stable at 5.24 Apr 2021.     2) BPH - No prostate meds or surgery. Prostate 66 g on [place]. AUASS = 16. Occ . PVR 28 ml. Frequency, nocturia, weak stream. UA clear. He mentioned the LUTS again and noted they are more bothersome.      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 Not sexually active. His wife had kTx and needs another one.   He returns and PSA lower. His wife recently died from a hip fx. She was already on HD. She got pneumonia.     PMH: Past Medical History:  Diagnosis Date   History of kidney stones    Thyroid cancer [facility])     Surgical History: Past Surgical History:  Procedure Laterality Date   COLONOSCOPY N/A 12/24/2018   Procedure: COLONOSCOPY;  Surgeon: Daneil Dolin, MD;  [facility];  Service: Endoscopy;  Laterality: N/A;  12:45pm   FLEXIBLE SIGMOIDOSCOPY N/A 06/18/2018   Procedure: FLEXIBLE SIGMOIDOSCOPY;  Surgeon: Daneil Dolin, MD;  [facility];  Service: Endoscopy;  Laterality: N/A;   THYROIDECTOMY N/A 01/14/2018   Procedure: TOTAL THYROIDECTOMY;  Surgeon: Aviva Signs, MD;  Location: [facility] ORS;  Service: General;  Laterality: N/A;   WRIST FRACTURE SURGERY Left 2004    Home Medications:  Allergies as of 05/23/2021   No Known Allergies      Medication  List        Accurate as of May 23, 2021  3:00 PM. If you have any questions, ask your nurse or doctor.          levothyroxine 137 MCG tablet Commonly known as: SYNTHROID TAKE 1 TABLET BY MOUTH ONCE DAILY BEFORE BREAKFAST   tamsulosin 0.4 MG Caps capsule Commonly known as: FLOMAX Take 1 capsule (0.4 mg total) by mouth daily after supper.   Vitamin D3 50 MCG (2000 UT) Tabs Take 2,000 Units by mouth 2 (two) times a day.        Allergies: No Known Allergies  Family History: Family History  Problem Relation Age of Onset   Diabetes Mother    Kidney disease Mother    Heart attack Father    Cancer Sister        hysterectomy due to cancer   Hypertension Brother    Heart disease Brother    Thyroid disease Brother    Colon cancer Neg Hx     Social History:  reports that he has never smoked. He has never used smokeless tobacco. He reports that he does not drink alcohol and does not use drugs.   Physical Exam: There were no vitals taken for this visit.  Constitutional:  Alert and oriented, No acute distress. HEENT: [place]  AT, moist mucus membranes.  Trachea midline, no masses. Cardiovascular: No clubbing, cyanosis, or edema. Respiratory: Normal respiratory effort, no increased work of breathing. GI: Abdomen is soft, nontender, nondistended, no abdominal masses GU: No CVA tenderness Skin: No rashes, bruises or suspicious lesions. Neurologic: Grossly intact, no focal deficits, moving all 4 extremities. Psychiatric: Normal mood and affect.  Laboratory Data: Lab Results  Component Value Date   WBC 5.5 10/25/2020   HGB 15.6 10/25/2020   HCT 46.4 10/25/2020   MCV 95 10/25/2020   PLT 218 10/25/2020    Lab Results  Component Value Date   CREATININE 0.92 10/25/2020    No results found for: PSA  No results found for: TESTOSTERONE  No results found for: HGBA1C  Urinalysis    Component Value Date/Time   APPEARANCEUR Clear 12/06/2020 1132   GLUCOSEU Negative  12/06/2020 1132   BILIRUBINUR Negative 12/06/2020 1132   PROTEINUR Negative 12/06/2020 1132   UROBILINOGEN negative 11/11/2013 1641   NITRITE Negative 12/06/2020 1132   LEUKOCYTESUR Negative 12/06/2020 1132    Lab Results  Component Value Date   LABMICR Comment 12/06/2020    Pertinent Imaging: N/a   Assessment & Plan:    1. Benign prostatic hyperplasia with urinary hesitancy Stable  2. PCa - PSA remains stable/lower. He really favors AS and continues to drive. Will follow.    No follow-ups on file.  Festus Aloe, MD  [facility]  [address] [place], [place] [address] [phone]

## 2021-10-03 ENCOUNTER — Ambulatory Visit (INDEPENDENT_AMBULATORY_CARE_PROVIDER_SITE_OTHER): Payer: Self-pay | Admitting: Family Medicine

## 2021-10-03 ENCOUNTER — Encounter: Payer: Self-pay | Admitting: Family Medicine

## 2021-10-03 VITALS — BP 135/87 | HR 65 | Temp 97.9°F | Ht 72.0 in | Wt 195.6 lb

## 2021-10-03 DIAGNOSIS — Z024 Encounter for examination for driving license: Secondary | ICD-10-CM

## 2021-10-03 LAB — URINALYSIS
Bilirubin, UA: NEGATIVE
Glucose, UA: NEGATIVE
Ketones, UA: NEGATIVE
Leukocytes,UA: NEGATIVE
Nitrite, UA: NEGATIVE
Protein,UA: NEGATIVE
RBC, UA: NEGATIVE
Specific Gravity, UA: 1.025 (ref 1.005–1.030)
Urobilinogen, Ur: 2 mg/dL — ABNORMAL HIGH (ref 0.2–1.0)
pH, UA: 6 (ref 5.0–7.5)

## 2021-10-03 NOTE — Progress Notes (Signed)
Dot exam. ?See attached document. ?Claretta Fraise, MD ? ?

## 2021-10-26 ENCOUNTER — Encounter: Payer: 59 | Admitting: Family Medicine
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
## 2022-01-20 ENCOUNTER — Other Ambulatory Visit: Payer: Self-pay | Admitting: Family Medicine

## 2022-01-24 ENCOUNTER — Telehealth: Payer: Self-pay | Admitting: Family Medicine

## 2022-01-24 NOTE — Telephone Encounter (Signed)
TC to pt re: thyroid medication, instructed pt to contact his Endocrinologist since he was seen & had labwork on 12/06/21 and looking at those notes they have different directions on his medication then we do. Pt agreed with this, gave him Dr. Thompson Caul phone #, he will call us back if he needs anything else and will keep his appt w/ Dr. Livia Snellen on 02/07/22

## 2022-01-24 NOTE — Telephone Encounter (Signed)
  Prescription Request  01/24/2022  Is this a "Controlled Substance" medicine? no  Have you seen your PCP in the last 2 weeks? Appt made for 8/22  If YES, route message to pool  -  If NO, patient needs to be scheduled for appointment.  What is the name of the medication or equipment? levothyroxine (SYNTHROID) 137 MCG tablet  Have you contacted your pharmacy to request a refill? Yes    Which pharmacy would you like this sent to? [facility], [place] [address]   Patient notified that their request is being sent to the clinical staff for review and that they should receive a response within 2 business days.

## 2022-01-26 ENCOUNTER — Telehealth: Payer: Self-pay

## 2022-01-26 ENCOUNTER — Other Ambulatory Visit: Payer: Self-pay

## 2022-01-26 DIAGNOSIS — C61 Malignant neoplasm of prostate: Secondary | ICD-10-CM

## 2022-01-26 NOTE — Telephone Encounter (Signed)
Patient came by office and advised he needed refill on below medication.   Medication: tamsulosin (FLOMAX) 0.4 MG CAPS capsule    Pharmacy: [facility] [address], [place] [address]

## 2022-01-27 ENCOUNTER — Telehealth: Payer: Self-pay

## 2022-01-27 LAB — PSA: Prostate Specific Ag, Serum: 5.2 ng/mL — ABNORMAL HIGH (ref 0.0–4.0)

## 2022-01-27 NOTE — Telephone Encounter (Signed)
Made patient aware that refill was sent into pharmacy on 08/01 and informed patient to call his pharmacy for refill pickup. Patient voiced understanding.

## 2022-01-30 ENCOUNTER — Other Ambulatory Visit: Payer: Self-pay

## 2022-01-30 MED ORDER — TAMSULOSIN HCL 0.4 MG PO CAPS: 0.4000 mg | ORAL_CAPSULE | Freq: Every day | ORAL | 0 refills | Status: DC

## 2022-01-30 NOTE — Progress Notes (Signed)
Refill for tamsulosin sent.  Refill for 30 days to last until next apt.

## 2022-02-06 ENCOUNTER — Ambulatory Visit (INDEPENDENT_AMBULATORY_CARE_PROVIDER_SITE_OTHER): Payer: Medicare Other | Admitting: Urology

## 2022-02-06 ENCOUNTER — Encounter: Payer: Self-pay | Admitting: Urology

## 2022-02-06 DIAGNOSIS — R972 Elevated prostate specific antigen [PSA]: Secondary | ICD-10-CM

## 2022-02-06 DIAGNOSIS — Z8546 Personal history of malignant neoplasm of prostate: Secondary | ICD-10-CM

## 2022-02-06 DIAGNOSIS — N401 Enlarged prostate with lower urinary tract symptoms: Secondary | ICD-10-CM | POA: Diagnosis not present

## 2022-02-06 DIAGNOSIS — C61 Malignant neoplasm of prostate: Secondary | ICD-10-CM | POA: Diagnosis not present

## 2022-02-06 DIAGNOSIS — N138 Other obstructive and reflux uropathy: Secondary | ICD-10-CM | POA: Diagnosis not present

## 2022-02-06 LAB — URINALYSIS, ROUTINE W REFLEX MICROSCOPIC
Bilirubin, UA: NEGATIVE
Glucose, UA: NEGATIVE
Leukocytes,UA: NEGATIVE
Nitrite, UA: NEGATIVE
Protein,UA: NEGATIVE
RBC, UA: NEGATIVE
Specific Gravity, UA: 1.025 (ref 1.005–1.030)
Urobilinogen, Ur: 1 mg/dL (ref 0.2–1.0)
pH, UA: 5.5 (ref 5.0–7.5)

## 2022-02-06 MED ORDER — TAMSULOSIN HCL 0.4 MG PO CAPS: 0.4000 mg | ORAL_CAPSULE | Freq: Every day | ORAL | 3 refills | Status: DC

## 2022-02-06 NOTE — Progress Notes (Unsigned)
02/06/2022 2:16 PM   Angel Chambers 1956-01-10 [id number]  Referring provider: Claretta Fraise, MD [place],  [place] [address]  No chief complaint on file.   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 His PSA was 05/20 PSA 3.8 and then 05/22 PSA 5.8. I thought he had a right prostate nodule on exam but at the biopsy in lateral decubitus, I got a better exam and prostate felt benign. No FH of PCa. He underwent prostate bx 01/03/2021.   Biopsy: #17 December 2020 Early Intermediate risk PCa PSA 5.8 T1c (R>L asymmetry) Prostate 66 g (psad 0.09) Gleason 3+4=7 in one core, 10% (pattern 4 10%) -right apex    His PSA remain lower/stable at 5.24 Apr 2021.     2) BPH - No prostate meds or surgery. Prostate 66 g on [place]. AUASS = 16. Occ . PVR 28 ml. Frequency, nocturia, weak stream. UA clear. He mentioned the LUTS again and noted they are more bothersome.      He returns and PSA lower. His Aug 2023 PSA down to 5.2. No voiding complaints.   His wife died from a hip fx in September 07, 2020. She was already on HD. She got pneumonia. His wife had kTx and needed another one.   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       PMH: Past Medical History:  Diagnosis Date   History of kidney stones    Thyroid cancer [facility])     Surgical History: Past Surgical History:  Procedure Laterality Date   COLONOSCOPY N/A 12/24/2018   Procedure: COLONOSCOPY;  Surgeon: Daneil Dolin, MD;  [facility];  Service: Endoscopy;  Laterality: N/A;  12:45pm   FLEXIBLE SIGMOIDOSCOPY N/A 06/18/2018   Procedure: FLEXIBLE SIGMOIDOSCOPY;  Surgeon: Daneil Dolin, MD;  [facility];  Service: Endoscopy;  Laterality: N/A;   THYROIDECTOMY N/A 01/14/2018   Procedure: TOTAL THYROIDECTOMY;  Surgeon: Aviva Signs, MD;  Location: [facility];  Service: General;  Laterality: N/A;   WRIST FRACTURE SURGERY Left Sep 08, 2002    Home Medications:  Allergies as of  02/06/2022   No Known Allergies      Medication List        Accurate as of February 06, 2022  2:16 PM. If you have any questions, ask your nurse or doctor.          levothyroxine 137 MCG tablet Commonly known as: SYNTHROID TAKE 1 TABLET BY MOUTH ONCE DAILY BEFORE BREAKFAST   tamsulosin 0.4 MG Caps capsule Commonly known as: FLOMAX Take 1 capsule (0.4 mg total) by mouth daily after supper.   Vitamin D3 50 MCG (2000 UT) Tabs Take 2,000 Units by mouth 2 (two) times a day.        Allergies: No Known Allergies  Family History: Family History  Problem Relation Age of Onset   Diabetes Mother    Kidney disease Mother    Heart attack Father    Cancer Sister        hysterectomy due to cancer   Hypertension Brother    Heart disease Brother    Thyroid disease Brother    Colon cancer Neg Hx     Social History:  reports that he has never smoked. He has never used smokeless tobacco. He reports that he does not drink alcohol and does not use drugs.   Physical Exam: There were  no vitals taken for this visit.  Constitutional:  Alert and oriented, No acute distress. HEENT: [place] AT, moist mucus membranes.  Trachea midline, no masses. Cardiovascular: No clubbing, cyanosis, or edema. Respiratory: Normal respiratory effort, no increased work of breathing. GI: Abdomen is soft, nontender, nondistended, no abdominal masses GU: No CVA tenderness Skin: No rashes, bruises or suspicious lesions. Neurologic: Grossly intact, no focal deficits, moving all 4 extremities. Psychiatric: Normal mood and affect. DRE: R>L asymmetry of prostate but no nodules, 50 g    Laboratory Data: Lab Results  Component Value Date   WBC 5.5 10/25/2020   HGB 15.6 10/25/2020   HCT 46.4 10/25/2020   MCV 95 10/25/2020   PLT 218 10/25/2020    Lab Results  Component Value Date   CREATININE 0.92 10/25/2020    No results found for: "PSA"  No results found for: "TESTOSTERONE"  No results found for:  "HGBA1C"  Urinalysis    Component Value Date/Time   APPEARANCEUR Clear 10/03/2021 1528   GLUCOSEU Negative 10/03/2021 1528   BILIRUBINUR Negative 10/03/2021 1528   PROTEINUR Negative 10/03/2021 1528   UROBILINOGEN negative 11/11/2013 1641   NITRITE Negative 10/03/2021 1528   LEUKOCYTESUR Negative 10/03/2021 1528    Lab Results  Component Value Date   LABMICR See below: 05/23/2021   WBCUA None seen 05/23/2021   LABEPIT None seen 05/23/2021   BACTERIA None seen 05/23/2021   Reviewed - PSA levels. Path report.    Assessment & Plan:    1. PCa - exam is stable/benign. PSA lower and PSAD nl. Disc repeat bx or mRI/ Cont surveillance and consider MRI in 6-12 mo  2. BPH - stable - tams refilled    No follow-ups on file.  Festus Aloe, MD  [facility]  [address] [place], [place] [address] [phone]

## 2022-02-07 ENCOUNTER — Ambulatory Visit (INDEPENDENT_AMBULATORY_CARE_PROVIDER_SITE_OTHER): Payer: Medicare Other | Admitting: Family Medicine

## 2022-02-07 ENCOUNTER — Encounter: Payer: Self-pay | Admitting: Family Medicine

## 2022-02-07 VITALS — BP 138/83 | HR 77 | Temp 98.2°F | Ht 72.0 in | Wt 195.8 lb

## 2022-02-07 DIAGNOSIS — N400 Enlarged prostate without lower urinary tract symptoms: Secondary | ICD-10-CM | POA: Diagnosis not present

## 2022-02-07 DIAGNOSIS — F985 Adult onset fluency disorder: Secondary | ICD-10-CM | POA: Diagnosis not present

## 2022-02-07 DIAGNOSIS — E038 Other specified hypothyroidism: Secondary | ICD-10-CM | POA: Diagnosis not present

## 2022-02-07 DIAGNOSIS — E559 Vitamin D deficiency, unspecified: Secondary | ICD-10-CM

## 2022-02-07 MED ORDER — LEVOTHYROXINE SODIUM 137 MCG PO TABS: 137.0000 ug | ORAL_TABLET | Freq: Every day | ORAL | 3 refills | Status: DC

## 2022-02-07 NOTE — Progress Notes (Signed)
 Subjective:  Patient ID: Angel Chambers, male    DOB: 11/21/1955  Age: 66 y.o. MRN: [id number]  CC: Medical Management of Chronic Issues   HPI Dekker T Rufer presents for  follow-up on  thyroid. The patient has a history of hypothyroidism for many years. It has been stable recently. Pt. denies any change in  voice, loss of hair, heat or cold intolerance. Energy level has been adequate to good. Patient denies constipation and diarrhea. No myxedema. Medication is as noted below. Verified that pt is taking it daily on an empty stomach. Well tolerated.  Also treated for BPH.      02/07/2022   12:49 PM 10/03/2021    3:17 PM 10/25/2020    2:18 PM  Depression screen PHQ 2/9  Decreased Interest 0 0 0  Down, Depressed, Hopeless 0 0 0  PHQ - 2 Score 0 0 0    History Iva has a past medical history of History of kidney stones and Thyroid cancer (HCC).   He has a past surgical history that includes Wrist fracture surgery (Left, 2004); Thyroidectomy (N/A, 01/14/2018); Flexible sigmoidoscopy (N/A, 06/18/2018); and Colonoscopy (N/A, 12/24/2018).   His family history includes Cancer in his sister; Diabetes in his mother; Heart attack in his father; Heart disease in his brother; Hypertension in his brother; Kidney disease in his mother; Thyroid disease in his brother.He reports that he has never smoked. He has never used smokeless tobacco. He reports that he does not drink alcohol and does not use drugs.    ROS Review of Systems  Constitutional:  Negative for fever.  Respiratory:  Negative for shortness of breath.   Cardiovascular:  Negative for chest pain.  Musculoskeletal:  Negative for arthralgias.  Skin:  Negative for rash.    Objective:  BP 138/83   Pulse 77   Temp 98.2 F (36.8 C)   Ht 6' (1.829 m)   Wt 195 lb 12.8 oz (88.8 kg)   SpO2 98%   BMI 26.56 kg/m   BP Readings from Last 3 Encounters:  02/07/22 138/83  10/03/21 135/87  05/23/21 (!) 144/93    Wt Readings from Last 3  Encounters:  02/07/22 195 lb 12.8 oz (88.8 kg)  10/03/21 195 lb 9.6 oz (88.7 kg)  05/23/21 200 lb (90.7 kg)     Physical Exam Constitutional:      General: He is not in acute distress.    Appearance: He is well-developed.  HENT:     Head: Normocephalic and atraumatic.     Right Ear: External ear normal.     Left Ear: External ear normal.     Nose: Nose normal.  Eyes:     Conjunctiva/sclera: Conjunctivae normal.     Pupils: Pupils are equal, round, and reactive to light.  Cardiovascular:     Rate and Rhythm: Normal rate and regular rhythm.     Heart sounds: Normal heart sounds. No murmur heard. Pulmonary:     Effort: Pulmonary effort is normal. No respiratory distress.     Breath sounds: Normal breath sounds. No wheezing or rales.  Abdominal:     Palpations: Abdomen is soft.     Tenderness: There is no abdominal tenderness.  Musculoskeletal:        General: Normal range of motion.     Cervical back: Normal range of motion and neck supple.  Skin:    General: Skin is warm and dry.  Neurological:     Mental Status: He is alert and   oriented to person, place, and time.     Deep Tendon Reflexes: Reflexes are normal and symmetric.  Psychiatric:        Behavior: Behavior normal.        Thought Content: Thought content normal.        Judgment: Judgment normal.       Assessment & Plan:   Trentin was seen today for medical management of chronic issues.  Diagnoses and all orders for this visit:  Other specified hypothyroidism -     CMP14+EGFR -     CBC with Differential/Platelet  Adult stuttering  Benign prostatic hyperplasia without lower urinary tract symptoms -     CMP14+EGFR -     CBC with Differential/Platelet  Vitamin D deficiency -     VITAMIN D 25 Hydroxy (Vit-D Deficiency, Fractures) -     CMP14+EGFR -     CBC with Differential/Platelet  Other orders -     levothyroxine (SYNTHROID) 137 MCG tablet; Take 1 tablet (137 mcg total) by mouth daily before  breakfast.       I have changed Weaver T. Nunziata's levothyroxine. I am also having him maintain his Vitamin D3 and tamsulosin.  Allergies as of 02/07/2022   No Known Allergies      Medication List        Accurate as of February 07, 2022  1:20 PM. If you have any questions, ask your nurse or doctor.          levothyroxine 137 MCG tablet Commonly known as: SYNTHROID Take 1 tablet (137 mcg total) by mouth daily before breakfast.   tamsulosin 0.4 MG Caps capsule Commonly known as: FLOMAX Take 1 capsule (0.4 mg total) by mouth daily after supper.   Vitamin D3 50 MCG (2000 UT) Tabs Take 2,000 Units by mouth 2 (two) times a day.         Follow-up: Return in about 6 months (around 08/10/2022) for [facility] physical.  Warren Stacks, M.D. 

## 2022-02-08 LAB — CMP14+EGFR
ALT: 13 IU/L (ref 0–44)
AST: 15 IU/L (ref 0–40)
Albumin/Globulin Ratio: 1.8 (ref 1.2–2.2)
Albumin: 4.4 g/dL (ref 3.9–4.9)
Alkaline Phosphatase: 80 IU/L (ref 44–121)
BUN/Creatinine Ratio: 12 (ref 10–24)
BUN: 14 mg/dL (ref 8–27)
Bilirubin Total: 0.5 mg/dL (ref 0.0–1.2)
CO2: 18 mmol/L — ABNORMAL LOW (ref 20–29)
Calcium: 9.5 mg/dL (ref 8.6–10.2)
Chloride: 107 mmol/L — ABNORMAL HIGH (ref 96–106)
Creatinine, Ser: 1.17 mg/dL (ref 0.76–1.27)
Globulin, Total: 2.4 g/dL (ref 1.5–4.5)
Glucose: 135 mg/dL — ABNORMAL HIGH (ref 70–99)
Potassium: 3.9 mmol/L (ref 3.5–5.2)
Sodium: 144 mmol/L (ref 134–144)
Total Protein: 6.8 g/dL (ref 6.0–8.5)
eGFR: 69 mL/min/{1.73_m2} (ref >59)

## 2022-02-08 LAB — CBC WITH DIFFERENTIAL/PLATELET
Basophils Absolute: 0 10*3/uL (ref 0.0–0.2)
Basos: 0 %
EOS (ABSOLUTE): 0.1 10*3/uL (ref 0.0–0.4)
Eos: 2 %
Hematocrit: 45.3 % (ref 37.5–51.0)
Hemoglobin: 15.1 g/dL (ref 13.0–17.7)
Immature Grans (Abs): 0 10*3/uL (ref 0.0–0.1)
Immature Granulocytes: 0 %
Lymphocytes Absolute: 1.4 10*3/uL (ref 0.7–3.1)
Lymphs: 28 %
MCH: 31.2 pg (ref 26.6–33.0)
MCHC: 33.3 g/dL (ref 31.5–35.7)
MCV: 94 fL (ref 79–97)
Monocytes Absolute: 0.3 10*3/uL (ref 0.1–0.9)
Monocytes: 7 %
Neutrophils Absolute: 3.2 10*3/uL (ref 1.4–7.0)
Neutrophils: 63 %
Platelets: 180 10*3/uL (ref 150–450)
RBC: 4.84 x10E6/uL (ref 4.14–5.80)
RDW: 13.1 % (ref 11.6–15.4)
WBC: 5.1 10*3/uL (ref 3.4–10.8)

## 2022-02-08 LAB — VITAMIN D 25 HYDROXY (VIT D DEFICIENCY, FRACTURES): Vit D, 25-Hydroxy: 61.4 ng/mL (ref 30.0–100.0)

## 2022-02-08 NOTE — Progress Notes (Signed)
Hello Berish,  Your lab result is normal and/or stable.Some minor variations that are not significant are commonly marked abnormal, but do not represent any medical problem for you.  Best regards, Claretta Fraise, M.D.

## 2022-02-23 NOTE — Progress Notes (Signed)
Letter open in error

## 2022-03-16 DIAGNOSIS — Z6827 Body mass index (BMI) 27.0-27.9, adult: Secondary | ICD-10-CM | POA: Diagnosis not present

## 2022-03-16 DIAGNOSIS — S61411A Laceration without foreign body of right hand, initial encounter: Secondary | ICD-10-CM | POA: Diagnosis not present

## 2022-06-07 DIAGNOSIS — C73 Malignant neoplasm of thyroid gland: Secondary | ICD-10-CM | POA: Diagnosis not present

## 2022-06-07 DIAGNOSIS — E890 Postprocedural hypothyroidism: Secondary | ICD-10-CM | POA: Diagnosis not present

## 2022-08-04 ENCOUNTER — Other Ambulatory Visit: Payer: Self-pay

## 2022-08-04 DIAGNOSIS — R972 Elevated prostate specific antigen [PSA]: Secondary | ICD-10-CM

## 2022-08-14 ENCOUNTER — Other Ambulatory Visit: Payer: Medicare Other

## 2022-08-17 ENCOUNTER — Encounter: Payer: Self-pay | Admitting: Radiology

## 2022-08-21 ENCOUNTER — Telehealth: Payer: Self-pay | Admitting: Family Medicine

## 2022-08-21 ENCOUNTER — Ambulatory Visit: Payer: Medicare Other | Admitting: Urology

## 2022-08-28 ENCOUNTER — Other Ambulatory Visit: Payer: Medicare Other

## 2022-08-28 DIAGNOSIS — R972 Elevated prostate specific antigen [PSA]: Secondary | ICD-10-CM | POA: Diagnosis not present

## 2022-08-29 LAB — PSA: Prostate Specific Ag, Serum: 6.5 ng/mL — ABNORMAL HIGH (ref 0.0–4.0)

## 2022-09-04 ENCOUNTER — Encounter: Payer: Self-pay | Admitting: Urology

## 2022-09-04 ENCOUNTER — Ambulatory Visit: Payer: Medicare Other | Admitting: Urology

## 2022-09-04 VITALS — BP 133/85 | HR 74 | Ht 72.0 in | Wt 195.8 lb

## 2022-09-04 DIAGNOSIS — C61 Malignant neoplasm of prostate: Secondary | ICD-10-CM | POA: Diagnosis not present

## 2022-09-04 DIAGNOSIS — N401 Enlarged prostate with lower urinary tract symptoms: Secondary | ICD-10-CM

## 2022-09-04 DIAGNOSIS — R3912 Poor urinary stream: Secondary | ICD-10-CM | POA: Diagnosis not present

## 2022-09-04 DIAGNOSIS — R972 Elevated prostate specific antigen [PSA]: Secondary | ICD-10-CM | POA: Diagnosis not present

## 2022-09-04 DIAGNOSIS — N138 Other obstructive and reflux uropathy: Secondary | ICD-10-CM

## 2022-09-04 MED ORDER — TAMSULOSIN HCL 0.4 MG PO CAPS: 0.4000 mg | ORAL_CAPSULE | Freq: Every day | ORAL | 3 refills | Status: DC

## 2022-09-04 NOTE — Progress Notes (Unsigned)
09/04/2022 3:42 PM   Angel Chambers 04-12-1956 [id number]  Referring provider: Claretta Fraise, [place],   [id number]  No chief complaint on file.   HPI: F/u -   1) PCa - pt was diagnosed with early intermediate risk PCa 07/22. His PSA was 05/20 PSA 3.8 and then 05/22 PSA 5.8. I thought he had a right prostate nodule on exam but at the biopsy in lateral decubitus, I got a better exam and prostate felt benign. No FH of PCa. He underwent prostate bx 01/03/2021. His PSA remain lower/stable at 5.24 Apr 2021. His Aug 2023 PSA down to 5.2. Aug 2023 exam with R>L asymmetry of prostate but no nodules, 50 g.    Biopsy: #17 December 2020 Early Intermediate risk PCa PSA 5.8 T1c (R>L asymmetry) Prostate 66 g (psad 0.09) Gleason 3+4=7 in one core, 10% (pattern 4 10%) - right apex   1/12  2) BPH - No prostate meds or surgery. Prostate 66 g on [place]. AUASS = 16. Occ . PVR 28 ml. Frequency, nocturia, weak stream. UA clear. He mentioned the LUTS again and noted they are more bothersome.     He returns in management of the above. No voiding complaints. His Mar 2024 PSA risen to 6.5 (PSAD 0.1). He is taking tamsulosin.    His wife died from a hip fx in 10-10-2020. She was already on HD. She got pneumonia. His wife had kTx and needed another one.    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   PMH: Past Medical History:  Diagnosis Date   History of kidney stones    Thyroid cancer [facility])     Surgical History: Past Surgical History:  Procedure Laterality Date   COLONOSCOPY N/A 12/24/2018   Procedure: COLONOSCOPY;  Surgeon: Daneil Dolin, MD;  [facility];  Service: Endoscopy;  Laterality: N/A;  12:45pm   FLEXIBLE SIGMOIDOSCOPY N/A 06/18/2018   Procedure: FLEXIBLE SIGMOIDOSCOPY;  Surgeon: Daneil Dolin, MD;  Location: [facility];  Service: Endoscopy;  Laterality: N/A;   THYROIDECTOMY N/A 01/14/2018   Procedure: TOTAL THYROIDECTOMY;  Surgeon: Aviva Signs,  MD;  Location: [facility];  Service: General;  Laterality: N/A;   WRIST FRACTURE SURGERY Left 10/11/02    Home Medications:  Allergies as of 09/04/2022   No Known Allergies      Medication List        Accurate as of September 04, 2022  3:42 PM. If you have any questions, ask your nurse or doctor.          levothyroxine 137 MCG tablet Commonly known as: SYNTHROID Take 1 tablet (137 mcg total) by mouth daily before breakfast.   tamsulosin 0.4 MG Caps capsule Commonly known as: FLOMAX Take 1 capsule (0.4 mg total) by mouth daily after supper.   Vitamin D3 50 MCG (2000 UT) Tabs Generic drug: Cholecalciferol Take 2,000 Units by mouth 2 (two) times a day.        Allergies: No Known Allergies  Family History: Family History  Problem Relation Age of Onset   Diabetes Mother    Kidney disease Mother    Heart attack Father    Cancer Sister        hysterectomy due to cancer   Hypertension Brother    Heart disease Brother    Thyroid disease Brother    Colon cancer Neg Hx     Social History:  reports that he has  never smoked. He has never used smokeless tobacco. He reports that he does not drink alcohol and does not use drugs.   Physical Exam: BP 133/85   Pulse 74   Ht 6' (1.829 m)   Wt 195 lb 12.8 oz (88.8 kg)   BMI 26.56 kg/m   Constitutional:  Alert and oriented, No acute distress. HEENT: [place] AT, moist mucus membranes.  Trachea midline, no masses. Cardiovascular: No clubbing, cyanosis, or edema. Respiratory: Normal respiratory effort, no increased work of breathing. GI: Abdomen is soft, nontender, nondistended, no abdominal masses GU: No CVA tenderness Skin: No rashes, bruises or suspicious lesions. Neurologic: Grossly intact, no focal deficits, moving all 4 extremities. Psychiatric: Normal mood and affect.  Laboratory Data: Lab Results  Component Value Date   WBC 5.1 02/07/2022   HGB 15.1 02/07/2022   HCT 45.3 02/07/2022   MCV 94 02/07/2022   PLT 180 02/07/2022     Lab Results  Component Value Date   CREATININE 1.17 02/07/2022    No results found for: "PSA" PSA reviewed No results found for: "TESTOSTERONE"  No results found for: "HGBA1C" Path report from 2022 reviewed  Urinalysis    Component Value Date/Time   APPEARANCEUR Clear 02/06/2022 1535   GLUCOSEU Negative 02/06/2022 1535   BILIRUBINUR Negative 02/06/2022 1535   PROTEINUR Negative 02/06/2022 1535   UROBILINOGEN negative 11/11/2013 1641   NITRITE Negative 02/06/2022 1535   LEUKOCYTESUR Negative 02/06/2022 1535    Lab Results  Component Value Date   LABMICR Comment 02/06/2022   WBCUA None seen 05/23/2021   LABEPIT None seen 05/23/2021   BACTERIA None seen 05/23/2021    Pertinent Imaging:   Assessment & Plan:    1. Prostate cancer -  PSA is up slightly. Will stage/screen with MRi prostate and consider cont AS, bx or treatment.   - Urinalysis, Routine w reflex microscopic   No follow-ups on file.  Festus Aloe, MD  [facility]  [address] [place], [place] [address] [phone]

## 2022-09-05 LAB — URINALYSIS, ROUTINE W REFLEX MICROSCOPIC
Bilirubin, UA: NEGATIVE
Glucose, UA: NEGATIVE
Leukocytes,UA: NEGATIVE
Nitrite, UA: NEGATIVE
RBC, UA: NEGATIVE
Specific Gravity, UA: 1.025 (ref 1.005–1.030)
Urobilinogen, Ur: 2 mg/dL — ABNORMAL HIGH (ref 0.2–1.0)
pH, UA: 6.5 (ref 5.0–7.5)

## 2022-09-13 ENCOUNTER — Ambulatory Visit (HOSPITAL_COMMUNITY)
Admission: RE | Admit: 2022-09-13 | Discharge: 2022-09-13 | Disposition: A | Discharge status: Home / Self Care | Payer: Medicare Other | Source: Ambulatory Visit | Attending: Urology | Admitting: Urology

## 2022-09-13 DIAGNOSIS — C61 Malignant neoplasm of prostate: Secondary | ICD-10-CM | POA: Insufficient documentation

## 2022-09-13 MED ORDER — GADOBUTROL 1 MMOL/ML IV SOLN
8.5000 mL | Freq: Once | INTRAVENOUS | Status: AC | PRN
  Administered 2022-09-13: 8.5 mL via INTRAVENOUS

## 2022-09-20 ENCOUNTER — Telehealth: Payer: Self-pay

## 2022-09-20 NOTE — Telephone Encounter (Signed)
Patient aware of MD response and lab apt scheduled 1 week prior to f/u.  Apt reminders mailed to patient.

## 2022-09-20 NOTE — Telephone Encounter (Signed)
-----   Message from Festus Aloe, MD sent at 09/19/2022 12:55 PM EDT ----- Let [name] know his prostate MRI looks good. Prostate cancer is contained to the prostate. We will check his PSA prior to follow-up in June or July and continue close monitoring. Thanks.   ----- Message ----- From: Audie Box, CMA Sent: 09/14/2022  11:02 AM EDT To: Festus Aloe, MD  Please review.

## 2022-09-26 DIAGNOSIS — K080 Exfoliation of teeth due to systemic causes: Secondary | ICD-10-CM | POA: Diagnosis not present

## 2022-10-26 DIAGNOSIS — K080 Exfoliation of teeth due to systemic causes: Secondary | ICD-10-CM | POA: Diagnosis not present

## 2022-11-20 ENCOUNTER — Other Ambulatory Visit (INDEPENDENT_AMBULATORY_CARE_PROVIDER_SITE_OTHER): Payer: Medicare Other | Admitting: Urology

## 2022-11-20 DIAGNOSIS — C61 Malignant neoplasm of prostate: Secondary | ICD-10-CM | POA: Diagnosis not present

## 2022-11-20 NOTE — Progress Notes (Signed)
11/20/2022 10:37 AM   Angel Chambers 1956-05-15 [id number]  Referring provider: Mechele Claude, MD [address] [place],  [place] [address]  No chief complaint on file.   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 His PSA was 05/20 PSA 3.8 and then 05/22 PSA 5.8. I thought he had a right prostate nodule on exam but at the biopsy in lateral decubitus, I got a better exam and prostate felt benign. No FH of PCa. He underwent prostate bx 01/03/2021. His PSA remain lower/stable at 5.24 Apr 2021. His Aug 2023 PSA down to 5.2. Aug 2023 exam with R>L asymmetry of prostate but no nodules, 50 g. His Mar 2024 PSA risen to 6.5 (PSAD 0.1).   Biopsy: #17 December 2020 Early Intermediate risk PCa PSA 5.8 T1c (R>L asymmetry) Prostate 66 g (psad 0.09) Gleason 3+4=7 in one core, 10% (pattern 4 10%) - right apex   1/12  Staging:    2) BPH - No prostate meds or surgery. Prostate 66 g on [place]. AUASS = 16. Occ . PVR 28 ml. Frequency, nocturia, weak stream. UA clear. He mentioned the LUTS again and noted they are more bothersome.    He had a PSA drawn today. He was not seen. He has a f/u in 2 weeks.   His Mar 2024 pMRI - ROI#1 a 1.5 cm PI-RADS 3 lesion of the left anterior transition zone and anterior fibromuscular with bulging of capsule. A ROI#2 1.6 cm PI-RADS 3 lesion of the right anterior and right posterolateral peripheral zone at the apex. Staging: Transcapsular spread - Not directly seen, although region of interest # 1 does bulge the anterior prostate contour. LN, SV, NVB, Bone negative. Prostate 55 g.    His wife died from a hip fx in Dec 09, 2020. She was already on HD. She got pneumonia. His wife had kTx and needed another one.    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     PMH: Past Medical History:  Diagnosis Date   History of kidney stones    Thyroid cancer [facility])     Surgical History: Past Surgical History:  Procedure Laterality Date    COLONOSCOPY N/A 12/24/2018   Procedure: COLONOSCOPY;  Surgeon: Corbin Ade, MD;  [facility];  Service: Endoscopy;  Laterality: N/A;  12:45pm   FLEXIBLE SIGMOIDOSCOPY N/A 06/18/2018   Procedure: FLEXIBLE SIGMOIDOSCOPY;  Surgeon: Corbin Ade, MD;  [facility];  Service: Endoscopy;  Laterality: N/A;   THYROIDECTOMY N/A 01/14/2018   Procedure: TOTAL THYROIDECTOMY;  Surgeon: Franky Macho, MD;  Location: [facility];  Service: General;  Laterality: N/A;   WRIST FRACTURE SURGERY Left 12/10/02    Home Medications:  Allergies as of 11/20/2022   No Known Allergies      Medication List        Accurate as of November 20, 2022 10:37 AM. If you have any questions, ask your nurse or doctor.          levothyroxine 137 MCG tablet Commonly known as: SYNTHROID Take 1 tablet (137 mcg total) by mouth daily before breakfast.   tamsulosin 0.4 MG Caps capsule Commonly known as: FLOMAX Take 1 capsule (0.4 mg total) by mouth daily after supper.   Vitamin D3 50 MCG (2000 UT) Tabs Take 2,000 Units by mouth 2 (two) times a day.        Allergies: No Known  Allergies  Family History: Family History  Problem Relation Age of Onset   Diabetes Mother    Kidney disease Mother    Heart attack Father    Cancer Sister        hysterectomy due to cancer   Hypertension Brother    Heart disease Brother    Thyroid disease Brother    Colon cancer Neg Hx     Social History:  reports that he has never smoked. He has never used smokeless tobacco. He reports that he does not drink alcohol and does not use drugs.   Physical Exam: There were no vitals taken for this visit.  Constitutional:  Alert and oriented, No acute distress. HEENT: [place] AT, moist mucus membranes.  Trachea midline, no masses. Cardiovascular: No clubbing, cyanosis, or edema. Respiratory: Normal respiratory effort, no increased work of breathing. GI: Abdomen is soft, nontender, nondistended, no abdominal masses GU: No CVA  tenderness Lymph: No cervical or inguinal lymphadenopathy. Skin: No rashes, bruises or suspicious lesions. Neurologic: Grossly intact, no focal deficits, moving all 4 extremities. Psychiatric: Normal mood and affect.  Laboratory Data: Lab Results  Component Value Date   WBC 5.1 02/07/2022   HGB 15.1 02/07/2022   HCT 45.3 02/07/2022   MCV 94 02/07/2022   PLT 180 02/07/2022    Lab Results  Component Value Date   CREATININE 1.17 02/07/2022    No results found for: "PSA"  No results found for: "TESTOSTERONE"  No results found for: "HGBA1C"  Urinalysis    Component Value Date/Time   APPEARANCEUR Clear 09/04/2022 1529   GLUCOSEU Negative 09/04/2022 1529   BILIRUBINUR Negative 09/04/2022 1529   PROTEINUR Trace 09/04/2022 1529   UROBILINOGEN negative 11/11/2013 1641   NITRITE Negative 09/04/2022 1529   LEUKOCYTESUR Negative 09/04/2022 1529    Lab Results  Component Value Date   LABMICR Comment 09/04/2022   WBCUA None seen 05/23/2021   LABEPIT None seen 05/23/2021   BACTERIA None seen 05/23/2021    Pertinent Imaging: pMRI - 2024 - images reviewed    Assessment & Plan:    1. Prostate cancer (HCC)  - PSA Total (Reflex To Free)   No follow-ups on file.  Jerilee Field, MD  [facility]  [address] [place], [place] [address] [phone]

## 2022-11-21 LAB — PSA TOTAL (REFLEX TO FREE): Prostate Specific Ag, Serum: 6.3 ng/mL — ABNORMAL HIGH (ref 0.0–4.0)

## 2022-11-21 LAB — FPSA% REFLEX
% FREE PSA: 31.9 %
PSA, FREE: 2.01 ng/mL

## 2022-11-27 ENCOUNTER — Ambulatory Visit: Payer: Medicare Other | Admitting: Urology

## 2022-11-29 ENCOUNTER — Ambulatory Visit (INDEPENDENT_AMBULATORY_CARE_PROVIDER_SITE_OTHER): Payer: Medicare Other

## 2022-11-29 VITALS — Ht 72.0 in | Wt 190.0 lb

## 2022-11-29 DIAGNOSIS — Z0000 Encounter for general adult medical examination without abnormal findings: Secondary | ICD-10-CM | POA: Diagnosis not present

## 2022-11-29 DIAGNOSIS — Z0100 Encounter for examination of eyes and vision without abnormal findings: Secondary | ICD-10-CM

## 2022-11-29 NOTE — Patient Instructions (Signed)
[name] , Thank you for taking time to come for your Medicare Wellness Visit. I appreciate your ongoing commitment to your health goals. Please review the following plan we discussed and let me know if I can assist you in the future.   These are the goals we discussed:  Goals      Exercise 3x per week (30 min per time)        This is a list of the screening recommended for you and due dates:  Health Maintenance  Topic Date Due   Zoster (Shingles) Vaccine (1 of 2) Never done   COVID-19 Vaccine (3 - Moderna risk series) 12/19/2019   Pneumonia Vaccine (1 of 1 - PCV) 02/08/2023*   Flu Shot  01/18/2023   Medicare Annual Wellness Visit  11/29/2023   DTaP/Tdap/Td vaccine (3 - Td or Tdap) 01/10/2026   Colon Cancer Screening  12/23/2028   Hepatitis C Screening  Completed   HPV Vaccine  Aged Out  *Topic was postponed. The date shown is not the original due date.    Advanced directives: Advance directive discussed with you today. I have provided a copy for you to complete at home and have notarized. Once this is complete please bring a copy in to our office so we can scan it into your chart.   Conditions/risks identified: Aim for 30 minutes of exercise or brisk walking, 6-8 glasses of water, and 5 servings of fruits and vegetables each day.   Next appointment: Follow up in one year for your annual wellness visit.   Preventive Care 29 Years and Older, Male  Preventive care refers to lifestyle choices and visits with your health care provider that can promote health and wellness. What does preventive care include? A yearly physical exam. This is also called an annual well check. Dental exams once or twice a year. Routine eye exams. Ask your health care provider how often you should have your eyes checked. Personal lifestyle choices, including: Daily care of your teeth and gums. Regular physical activity. Eating a healthy diet. Avoiding tobacco and drug use. Limiting alcohol  use. Practicing safe sex. Taking low doses of aspirin every day. Taking vitamin and mineral supplements as recommended by your health care provider. What happens during an annual well check? The services and screenings done by your health care provider during your annual well check will depend on your age, overall health, lifestyle risk factors, and family history of disease. Counseling  Your health care provider may ask you questions about your: Alcohol use. Tobacco use. Drug use. Emotional well-being. Home and relationship well-being. Sexual activity. Eating habits. History of falls. Memory and ability to understand (cognition). Work and work Astronomer. Screening  You may have the following tests or measurements: Height, weight, and BMI. Blood pressure. Lipid and cholesterol levels. These may be checked every 5 years, or more frequently if you are over 51 years old. Skin check. Lung cancer screening. You may have this screening every year starting at age 89 if you have a 30-pack-year history of smoking and currently smoke or have quit within the past 15 years. Fecal occult blood test (FOBT) of the stool. You may have this test every year starting at age 99. Flexible sigmoidoscopy or colonoscopy. You may have a sigmoidoscopy every 5 years or a colonoscopy every 10 years starting at age 69. Prostate cancer screening. Recommendations will vary depending on your family history and other risks. Hepatitis C blood test. Hepatitis B blood test. Sexually transmitted disease (  STD) testing. Diabetes screening. This is done by checking your blood sugar (glucose) after you have not eaten for a while (fasting). You may have this done every 1-3 years. Abdominal aortic aneurysm (AAA) screening. You may need this if you are a current or former smoker. Osteoporosis. You may be screened starting at age 65 if you are at high risk. Talk with your health care provider about your test results,  treatment options, and if necessary, the need for more tests. Vaccines  Your health care provider may recommend certain vaccines, such as: Influenza vaccine. This is recommended every year. Tetanus, diphtheria, and acellular pertussis (Tdap, Td) vaccine. You may need a Td booster every 10 years. Zoster vaccine. You may need this after age 4. Pneumococcal 13-valent conjugate (PCV13) vaccine. One dose is recommended after age 50. Pneumococcal polysaccharide (PPSV23) vaccine. One dose is recommended after age 57. Talk to your health care provider about which screenings and vaccines you need and how often you need them. This information is not intended to replace advice given to you by your health care provider. Make sure you discuss any questions you have with your health care provider. Document Released: 07/02/2015 Document Revised: 02/23/2016 Document Reviewed: 04/06/2015 Elsevier Interactive Patient Education  2017 [place] Prevention in the Home Falls can cause injuries. They can happen to people of all ages. There are many things you can do to make your home safe and to help prevent falls. What can I do on the outside of my home? Regularly fix the edges of walkways and driveways and fix any cracks. Remove anything that might make you trip as you walk through a door, such as a raised step or threshold. Trim any bushes or trees on the path to your home. Use bright outdoor lighting. Clear any walking paths of anything that might make someone trip, such as rocks or tools. Regularly check to see if handrails are loose or broken. Make sure that both sides of any steps have handrails. Any raised decks and porches should have guardrails on the edges. Have any leaves, snow, or ice cleared regularly. Use sand or salt on walking paths during winter. Clean up any spills in your garage right away. This includes oil or grease spills. What can I do in the bathroom? Use night  lights. Install grab bars by the toilet and in the tub and shower. Do not use towel bars as grab bars. Use non-skid mats or decals in the tub or shower. If you need to sit down in the shower, use a plastic, non-slip stool. Keep the floor dry. Clean up any water that spills on the floor as soon as it happens. Remove soap buildup in the tub or shower regularly. Attach bath mats securely with double-sided non-slip rug tape. Do not have throw rugs and other things on the floor that can make you trip. What can I do in the bedroom? Use night lights. Make sure that you have a light by your bed that is easy to reach. Do not use any sheets or blankets that are too big for your bed. They should not hang down onto the floor. Have a firm chair that has side arms. You can use this for support while you get dressed. Do not have throw rugs and other things on the floor that can make you trip. What can I do in the kitchen? Clean up any spills right away. Avoid walking on wet floors. Keep items that you use a lot  in easy-to-reach places. If you need to reach something above you, use a strong step stool that has a grab bar. Keep electrical cords out of the way. Do not use floor polish or wax that makes floors slippery. If you must use wax, use non-skid floor wax. Do not have throw rugs and other things on the floor that can make you trip. What can I do with my stairs? Do not leave any items on the stairs. Make sure that there are handrails on both sides of the stairs and use them. Fix handrails that are broken or loose. Make sure that handrails are as long as the stairways. Check any carpeting to make sure that it is firmly attached to the stairs. Fix any carpet that is loose or worn. Avoid having throw rugs at the top or bottom of the stairs. If you do have throw rugs, attach them to the floor with carpet tape. Make sure that you have a light switch at the top of the stairs and the bottom of the stairs. If  you do not have them, ask someone to add them for you. What else can I do to help prevent falls? Wear shoes that: Do not have high heels. Have rubber bottoms. Are comfortable and fit you well. Are closed at the toe. Do not wear sandals. If you use a stepladder: Make sure that it is fully opened. Do not climb a closed stepladder. Make sure that both sides of the stepladder are locked into place. Ask someone to hold it for you, if possible. Clearly mark and make sure that you can see: Any grab bars or handrails. First and last steps. Where the edge of each step is. Use tools that help you move around (mobility aids) if they are needed. These include: Canes. Walkers. Scooters. Crutches. Turn on the lights when you go into a dark area. Replace any light bulbs as soon as they burn out. Set up your furniture so you have a clear path. Avoid moving your furniture around. If any of your floors are uneven, fix them. If there are any pets around you, be aware of where they are. Review your medicines with your doctor. Some medicines can make you feel dizzy. This can increase your chance of falling. Ask your doctor what other things that you can do to help prevent falls. This information is not intended to replace advice given to you by your health care provider. Make sure you discuss any questions you have with your health care provider. Document Released: 04/01/2009 Document Revised: 11/11/2015 Document Reviewed: 07/10/2014 Elsevier Interactive Patient Education  2017 [employer].

## 2022-11-29 NOTE — Progress Notes (Signed)
Subjective:   Angel Chambers is a 67 y.o. male who presents for Medicare Annual/Subsequent preventive examination. I connected with  Angel Chambers on 11/29/22 by a audio enabled telemedicine application and verified that I am speaking with the correct person using two identifiers.  Patient Location: Home  Provider Location: Home Office  I discussed the limitations of evaluation and management by telemedicine. The patient expressed understanding and agreed to proceed.  Review of Systems     Cardiac Risk Factors include: advanced age (>67men, >63 women);male gender     Objective:    Today's Vitals   11/29/22 1542  Weight: 190 lb (86.2 kg)  Height: 6' (1.829 m)   Body mass index is 25.77 kg/m.     11/29/2022    3:45 PM 05/17/2020    4:54 PM 12/24/2018    8:34 AM 06/18/2018    9:24 AM 02/12/2018    3:05 PM 01/14/2018   11:39 AM 01/14/2018    6:28 AM  Advanced Directives  Does Patient Have a Medical Advance Directive? No No No No No No No  Would patient like information on creating a medical advance directive? No - Patient declined  No - Patient declined No - Patient declined No - Patient declined No - Patient declined No - Patient declined    Current Medications (verified) Outpatient Encounter Medications as of 11/29/2022  Medication Sig   Cholecalciferol (VITAMIN D3) 50 MCG (2000 UT) TABS Take 2,000 Units by mouth 2 (two) times a day.    levothyroxine (SYNTHROID) 137 MCG tablet Take 1 tablet (137 mcg total) by mouth daily before breakfast.   tamsulosin (FLOMAX) 0.4 MG CAPS capsule Take 1 capsule (0.4 mg total) by mouth daily after supper.   No facility-administered encounter medications on file as of 11/29/2022.    Allergies (verified) Patient has no known allergies.   History: Past Medical History:  Diagnosis Date   History of kidney stones    Thyroid cancer [facility])    Past Surgical History:  Procedure Laterality Date   COLONOSCOPY N/A 12/24/2018   Procedure:  COLONOSCOPY;  Surgeon: Corbin Ade, MD;  Location: [facility];  Service: Endoscopy;  Laterality: N/A;  12:45pm   FLEXIBLE SIGMOIDOSCOPY N/A 06/18/2018   Procedure: FLEXIBLE SIGMOIDOSCOPY;  Surgeon: Corbin Ade, MD;  Location: [facility];  Service: Endoscopy;  Laterality: N/A;   THYROIDECTOMY N/A 01/14/2018   Procedure: TOTAL THYROIDECTOMY;  Surgeon: Franky Macho, MD;  Location: [facility] ORS;  Service: General;  Laterality: N/A;   WRIST FRACTURE SURGERY Left 2004   Family History  Problem Relation Age of Onset   Diabetes Mother    Kidney disease Mother    Heart attack Father    Cancer Sister        hysterectomy due to cancer   Hypertension Brother    Heart disease Brother    Thyroid disease Brother    Colon cancer Neg Hx    Social History   Socioeconomic History   Marital status: Widowed    Spouse name: Not on file   Number of children: Not on file   Years of education: Not on file   Highest education level: Not on file  Occupational History   Not on file  Tobacco Use   Smoking status: Never   Smokeless tobacco: Never  Vaping Use   Vaping Use: Never used  Substance and Sexual Activity   Alcohol use: No   Drug use: No   Sexual activity: Yes  Other Topics Concern   Not on file  Social History Narrative   Not on file   Social Determinants of Health   Financial Resource Strain: Low Risk  (11/29/2022)   Overall Financial Resource Strain (CARDIA)    Difficulty of Paying Living Expenses: Not hard at all  Food Insecurity: No Food Insecurity (11/29/2022)   Hunger Vital Sign    Worried About Running Out of Food in the Last Year: Never true    Ran Out of Food in the Last Year: Never true  Transportation Needs: No Transportation Needs (11/29/2022)   PRAPARE - Administrator, Civil Service (Medical): No    Lack of Transportation (Non-Medical): No  Physical Activity: Insufficiently Active (11/29/2022)   Exercise Vital Sign    Days of Exercise per Week: 3 days     Minutes of Exercise per Session: 30 min  Stress: No Stress Concern Present (11/29/2022)   [employer] of Occupational Health - Occupational Stress Questionnaire    Feeling of Stress : Not at all  Social Connections: Moderately Isolated (11/29/2022)   Social Connection and Isolation Panel [NHANES]    Frequency of Communication with Friends and Family: More than three times a week    Frequency of Social Gatherings with Friends and Family: More than three times a week    Attends Religious Services: More than 4 times per year    Active Member of [employer] or Organizations: No    Attends Banker Meetings: Never    Marital Status: Widowed    Tobacco Counseling Counseling given: Not Answered   Clinical Intake:  Pre-visit preparation completed: Yes  Pain : No/denies pain     Nutritional Risks: None Diabetes: No  How often do you need to have someone help you when you read instructions, pamphlets, or other written materials from your doctor or pharmacy?: 1 - Never  Diabetic?no   Interpreter Needed?: No  Information entered by :: Renie Ora, LPN   Activities of Daily Living    11/29/2022    3:45 PM  In your present state of health, do you have any difficulty performing the following activities:  Hearing? 0  Vision? 0  Difficulty concentrating or making decisions? 0  Walking or climbing stairs? 0  Dressing or bathing? 0  Doing errands, shopping? 0  Preparing Food and eating ? N  Using the Toilet? N  In the past six months, have you accidently leaked urine? N  Do you have problems with loss of bowel control? N  Managing your Medications? N  Managing your Finances? N  Housekeeping or managing your Housekeeping? N    Patient Care Team: Mechele Claude, MD as PCP - General (Family Medicine) Jena Gauss Gerrit Friends, MD as Consulting Physician (Gastroenterology)  Indicate any recent Medical Services you may have received from other than [facility] providers in the past  year (date may be approximate).     Assessment:   This is a routine wellness examination for Angel Chambers.  Hearing/Vision screen Vision Screening - Comments:: Referral 11/29/2022  Dietary issues and exercise activities discussed: Current Exercise Habits: Home exercise routine, Type of exercise: walking, Time (Minutes): 30, Frequency (Times/Week): 3, Weekly Exercise (Minutes/Week): 90, Intensity: Mild, Exercise limited by: None identified   Goals Addressed             This Visit's Progress    Exercise 3x per week (30 min per time)         Depression Screen    11/29/2022  3:44 PM 02/07/2022   12:49 PM 10/03/2021    3:17 PM 10/25/2020    2:18 PM 11/03/2019    4:23 PM 05/05/2019    4:25 PM 10/31/2018    9:24 AM  PHQ 2/9 Scores  PHQ - 2 Score 0 0 0 0 0 0 0    Fall Risk    11/29/2022    3:43 PM 02/07/2022   12:49 PM 10/03/2021    3:17 PM 10/25/2020    2:18 PM 11/03/2019    4:23 PM  Fall Risk   Falls in the past year? 0 0 0 0 0  Number falls in past yr: 0    0  Injury with Fall? 0    0  Risk for fall due to : No Fall Risks    No Fall Risks  Follow up Falls prevention discussed    Falls evaluation completed    FALL RISK PREVENTION PERTAINING TO THE HOME:  Any stairs in or around the home? Yes  If so, are there any without handrails? No  Home free of loose throw rugs in walkways, pet beds, electrical cords, etc? Yes  Adequate lighting in your home to reduce risk of falls? Yes   ASSISTIVE DEVICES UTILIZED TO PREVENT FALLS:  Life alert? No  Use of a cane, walker or w/c? No  Grab bars in the bathroom? Yes  Shower chair or bench in shower? Yes  Elevated toilet seat or a handicapped toilet? No        11/29/2022    3:45 PM  6CIT Screen  What Year? 0 points  What month? 0 points  What time? 0 points  Count back from 20 0 points  Months in reverse 0 points  Repeat phrase 0 points  Total Score 0 points    Immunizations Immunization History  Administered Date(s)  Administered   Moderna Sars-Covid-2 Vaccination 10/17/2019, 11/21/2019   Tdap 06/19/2012, 01/11/2016    TDAP status: Up to date  Flu Vaccine status: Declined, Education has been provided regarding the importance of this vaccine but patient still declined. Advised may receive this vaccine at local pharmacy or Health Dept. Aware to provide a copy of the vaccination record if obtained from local pharmacy or Health Dept. Verbalized acceptance and understanding.  Pneumococcal vaccine status: Due, Education has been provided regarding the importance of this vaccine. Advised may receive this vaccine at local pharmacy or Health Dept. Aware to provide a copy of the vaccination record if obtained from local pharmacy or Health Dept. Verbalized acceptance and understanding.  Covid-19 vaccine status: Declined, Education has been provided regarding the importance of this vaccine but patient still declined. Advised may receive this vaccine at local pharmacy or Health Dept.or vaccine clinic. Aware to provide a copy of the vaccination record if obtained from local pharmacy or Health Dept. Verbalized acceptance and understanding.  Qualifies for Shingles Vaccine? Yes   Zostavax completed No   Shingrix Completed?: No.    Education has been provided regarding the importance of this vaccine. Patient has been advised to call insurance company to determine out of pocket expense if they have not yet received this vaccine. Advised may also receive vaccine at local pharmacy or Health Dept. Verbalized acceptance and understanding.  Screening Tests Health Maintenance  Topic Date Due   Zoster Vaccines- Shingrix (1 of 2) Never done   COVID-19 Vaccine (3 - Moderna risk series) 12/19/2019   Pneumonia Vaccine 53+ Years old (1 of 1 - PCV) 02/08/2023 (Originally 11/14/2021)  INFLUENZA VACCINE  01/18/2023   Medicare Annual Wellness (AWV)  11/29/2023   DTaP/Tdap/Td (3 - Td or Tdap) 01/10/2026   Colonoscopy  12/23/2028    Hepatitis C Screening  Completed   HPV VACCINES  Aged Out    Health Maintenance  Health Maintenance Due  Topic Date Due   Zoster Vaccines- Shingrix (1 of 2) Never done   COVID-19 Vaccine (3 - Moderna risk series) 12/19/2019    Colorectal cancer screening: Type of screening: Colonoscopy. Completed 12/24/2018. Repeat every 10 years  Lung Cancer Screening: (Low Dose CT Chest recommended if Age 6-80 years, 30 pack-year currently smoking OR have quit w/in 15years.) does not qualify.   Lung Cancer Screening Referral: n/a  Additional Screening:  Hepatitis C Screening: does not qualify; Completed 10/30/2017  Vision Screening: Recommended annual ophthalmology exams for early detection of glaucoma and other disorders of the eye. Is the patient up to date with their annual eye exam?  No  Who is the provider or what is the name of the office in which the patient attends annual eye exams? None referral 11/29/2022 If pt is not established with a provider, would they like to be referred to a provider to establish care? No .   Dental Screening: Recommended annual dental exams for proper oral hygiene  Community Resource Referral / Chronic Care Management: CRR required this visit?  No   CCM required this visit?  No      Plan:     I have personally reviewed and noted the following in the patient's chart:   Medical and social history Use of alcohol, tobacco or illicit drugs  Current medications and supplements including opioid prescriptions. Patient is not currently taking opioid prescriptions. Functional ability and status Nutritional status Physical activity Advanced directives List of other physicians Hospitalizations, surgeries, and ER visits in previous 12 months Vitals Screenings to include cognitive, depression, and falls Referrals and appointments  In addition, I have reviewed and discussed with patient certain preventive protocols, quality metrics, and best practice  recommendations. A written personalized care plan for preventive services as well as general preventive health recommendations were provided to patient.     Lorrene Reid, LPN   09/25/8117   Nurse Notes: none

## 2022-12-04 ENCOUNTER — Ambulatory Visit: Payer: Medicare Other | Admitting: Urology

## 2022-12-04 ENCOUNTER — Encounter: Payer: Self-pay | Admitting: Urology

## 2022-12-04 VITALS — BP 145/89 | HR 77

## 2022-12-04 DIAGNOSIS — C61 Malignant neoplasm of prostate: Secondary | ICD-10-CM

## 2022-12-04 DIAGNOSIS — R972 Elevated prostate specific antigen [PSA]: Secondary | ICD-10-CM | POA: Diagnosis not present

## 2022-12-04 MED ORDER — LEVOFLOXACIN 750 MG PO TABS
750.0000 mg | ORAL_TABLET | Freq: Once | ORAL | 0 refills | Status: AC
Start: 2022-12-04 — End: 2022-12-04

## 2022-12-04 NOTE — Patient Instructions (Addendum)
   Appointment Time: 8:15 am Appointment Date: December 18, 2022  Location: [facility] Radiology Department   Prostate Biopsy Instructions  Stop all aspirin or blood thinners (aspirin, plavix, coumadin, warfarin, motrin, ibuprofen, advil, aleve, naproxen, naprosyn) for 7 days prior to the procedure.  If you have any questions about stopping these medications, please contact your primary care physician or cardiologist.  Having a light meal prior to the procedure is recommended.  If you are diabetic or have low blood sugar please bring a small snack or glucose tablet.  A Fleets enema is needed to be purchased over the counter at a local pharmacy and used 2 hours before you scheduled appointment.  This can be purchased over the counter at any pharmacy.  Antibiotics will be administered in the clinic at the time of the procedure and 1 tablet has been sent to your pharmacy. Please take the antibiotic as prescribed.    Please bring someone with you to the procedure to drive you home if you are given a valium to take prior to your procedure.   If you have any questions or concerns, please feel free to call the office at [phone] or send a Mychart message.    Thank you, [facility]

## 2022-12-04 NOTE — Progress Notes (Unsigned)
12/04/2022 3:51 PM   [name] 1956/03/21 [id number]  Referring provider: Mechele Claude, MD [address] [place],  [place] [address]  No chief complaint on file.   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 His PSA was 05/20 PSA 3.8 and then 05/22 PSA 5.8. I thought he had a right prostate nodule on exam but at the biopsy in lateral decubitus, I got a better exam and prostate felt benign. No FH of PCa. He underwent prostate bx 01/03/2021. His PSA remain lower/stable at 5.24 Apr 2021. His Aug 2023 PSA down to 5.2. Aug 2023 exam with R>L asymmetry of prostate but no nodules, 50 g.    Biopsy: #17 December 2020 Early Intermediate risk PCa PSA 5.8 T1c (R>L asymmetry) Prostate 66 g (psad 0.09) Gleason 3+4=7 in one core, 10% (pattern 4 10%) - right apex   1/12   2) BPH - He is taking tamsulosin. No prostate surgery. Prostate 66 g on [place]. AUASS = 16. Occ . PVR 28 ml. Frequency, nocturia, weak stream. UA clear. He mentioned the LUTS again and noted they are more bothersome.     He returns in management of the above. No voiding complaints. His Mar 2024 PSA risen to 6.5 (PSAD 0.1) and 07-15-24PSA stable at 6.3. He underwent Mar 2024 pMRI which showed ROI#1 18 mm PIRADS 3 anterior apical FMS with capsular bulging, ROI#2 16 mm PIRADS 3 right PZ. Prostate 55 g. Staging negative apart from poss ECE with capsular bulge.    His wife died from a hip fx in Dec 31, 2020. She was already on HD. She got pneumonia. His wife had kTx and needed another one.    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   PMH: Past Medical History:  Diagnosis Date   History of kidney stones    Thyroid cancer [facility])     Surgical History: Past Surgical History:  Procedure Laterality Date   COLONOSCOPY N/A 12/24/2018   Procedure: COLONOSCOPY;  Surgeon: Corbin Ade, MD;  [facility];  Service: Endoscopy;  Laterality: N/A;  12:45pm   FLEXIBLE SIGMOIDOSCOPY N/A  06/18/2018   Procedure: FLEXIBLE SIGMOIDOSCOPY;  Surgeon: Corbin Ade, MD;  Location: [facility];  Service: Endoscopy;  Laterality: N/A;   THYROIDECTOMY N/A 01/14/2018   Procedure: TOTAL THYROIDECTOMY;  Surgeon: Franky Macho, MD;  Location: [facility] ORS;  Service: General;  Laterality: N/A;   WRIST FRACTURE SURGERY Left 2003-01-01    Home Medications:  Allergies as of 12/04/2022   No Known Allergies      Medication List        Accurate as of December 04, 2022  3:51 PM. If you have any questions, ask your nurse or doctor.          levothyroxine 137 MCG tablet Commonly known as: SYNTHROID Take 1 tablet (137 mcg total) by mouth daily before breakfast.   tamsulosin 0.4 MG Caps capsule Commonly known as: FLOMAX Take 1 capsule (0.4 mg total) by mouth daily after supper.   Vitamin D3 50 MCG (2000 UT) Tabs Take 2,000 Units by mouth 2 (two) times a day.        Allergies: No Known Allergies  Family History: Family History  Problem Relation Age of Onset   Diabetes Mother    Kidney disease Mother    Heart attack Father    Cancer Sister  hysterectomy due to cancer   Hypertension Brother    Heart disease Brother    Thyroid disease Brother    Colon cancer Neg Hx     Social History:  reports that he has never smoked. He has never used smokeless tobacco. He reports that he does not drink alcohol and does not use drugs.   Physical Exam: BP (!) 145/89   Pulse 77   Constitutional:  Alert and oriented, No acute distress. HEENT: [place] AT, moist mucus membranes.  Trachea midline, no masses. Cardiovascular: No clubbing, cyanosis, or edema. Respiratory: Normal respiratory effort, no increased work of breathing. GI: Abdomen is soft, nontender, nondistended, no abdominal masses GU: No CVA tenderness Lymph: No cervical or inguinal lymphadenopathy. Skin: No rashes, bruises or suspicious lesions. Neurologic: Grossly intact, no focal deficits, moving all 4 extremities. Psychiatric:  Normal mood and affect.  Laboratory Data: Lab Results  Component Value Date   WBC 5.1 02/07/2022   HGB 15.1 02/07/2022   HCT 45.3 02/07/2022   MCV 94 02/07/2022   PLT 180 02/07/2022    Lab Results  Component Value Date   CREATININE 1.17 02/07/2022    No results found for: "PSA"  No results found for: "TESTOSTERONE"  No results found for: "HGBA1C"  Urinalysis    Component Value Date/Time   APPEARANCEUR Clear 09/04/2022 1529   GLUCOSEU Negative 09/04/2022 1529   BILIRUBINUR Negative 09/04/2022 1529   PROTEINUR Trace 09/04/2022 1529   UROBILINOGEN negative 11/11/2013 1641   NITRITE Negative 09/04/2022 1529   LEUKOCYTESUR Negative 09/04/2022 1529    Lab Results  Component Value Date   LABMICR Comment 09/04/2022   WBCUA None seen 05/23/2021   LABEPIT None seen 05/23/2021   BACTERIA None seen 05/23/2021    Pertinent Imaging: Mar 2024 pMRI images - reviewed path report and MRI images with Francis Dowse.   Assessment & Plan:    1. Prostate cancer [facility]) Discussed PSA and MRI findings. Discussed the nature r/b/a to AD vs tx with surgery and XRT.   He would like continue active surveillance but would consider tx so we will repeat prostate bx with cognitive fusion.   - Urinalysis, Routine w reflex microscopic    No follow-ups on file.  Jerilee Field, MD  [facility]  [address] [place], [place] [address] [phone]

## 2022-12-05 LAB — URINALYSIS, ROUTINE W REFLEX MICROSCOPIC
Bilirubin, UA: NEGATIVE
Glucose, UA: NEGATIVE
Ketones, UA: NEGATIVE
Leukocytes,UA: NEGATIVE
Nitrite, UA: NEGATIVE
Protein,UA: NEGATIVE
RBC, UA: NEGATIVE
Specific Gravity, UA: 1.03 (ref 1.005–1.030)
Urobilinogen, Ur: 4 mg/dL — ABNORMAL HIGH (ref 0.2–1.0)
pH, UA: 6 (ref 5.0–7.5)

## 2022-12-07 DIAGNOSIS — E890 Postprocedural hypothyroidism: Secondary | ICD-10-CM | POA: Diagnosis not present

## 2022-12-07 DIAGNOSIS — C73 Malignant neoplasm of thyroid gland: Secondary | ICD-10-CM | POA: Diagnosis not present

## 2022-12-12 DIAGNOSIS — C73 Malignant neoplasm of thyroid gland: Secondary | ICD-10-CM | POA: Diagnosis not present

## 2022-12-18 ENCOUNTER — Ambulatory Visit (HOSPITAL_COMMUNITY)
Admission: RE | Admit: 2022-12-18 | Discharge: 2022-12-18 | Disposition: A | Discharge status: Home / Self Care | Payer: Medicare Other | Source: Ambulatory Visit | Attending: Urology | Admitting: Urology

## 2022-12-18 ENCOUNTER — Ambulatory Visit (HOSPITAL_BASED_OUTPATIENT_CLINIC_OR_DEPARTMENT_OTHER): Payer: Medicare Other | Admitting: Urology

## 2022-12-18 ENCOUNTER — Encounter (HOSPITAL_COMMUNITY): Payer: Self-pay

## 2022-12-18 ENCOUNTER — Other Ambulatory Visit: Payer: Self-pay | Admitting: Urology

## 2022-12-18 VITALS — BP 151/89 | HR 59 | Temp 98.0°F | Resp 18

## 2022-12-18 DIAGNOSIS — C61 Malignant neoplasm of prostate: Secondary | ICD-10-CM | POA: Insufficient documentation

## 2022-12-18 MED ORDER — LIDOCAINE HCL (PF) 2 % IJ SOLN
10.0000 mL | Freq: Once | INTRAMUSCULAR | Status: AC
  Administered 2022-12-18: 10 mL

## 2022-12-18 MED ORDER — LIDOCAINE HCL (PF) 2 % IJ SOLN
INTRAMUSCULAR | Status: AC
  Filled 2022-12-18: qty 10

## 2022-12-18 MED ORDER — GENTAMICIN SULFATE 40 MG/ML IJ SOLN
160.0000 mg | Freq: Once | INTRAMUSCULAR | Status: AC
  Administered 2022-12-18: 160 mg via INTRAMUSCULAR

## 2022-12-18 MED ORDER — GENTAMICIN SULFATE 40 MG/ML IJ SOLN
INTRAMUSCULAR | Status: AC
  Filled 2022-12-18: qty 4

## 2022-12-18 NOTE — Progress Notes (Signed)
PT tolerated prostate biopsy procedure and antibiotic injection well today. Labs obtained and sent for pathology to lab by Richard from ultrasound at [phone]. PT ambulatory at discharge with no acute distress noted and verbalized understanding of discharge instructions.

## 2022-12-18 NOTE — Progress Notes (Signed)
F/u -    1) PCa - pt was diagnosed with early intermediate risk PCa 07/22. His PSA was 05/20 PSA 3.8 and then 05/22 PSA 5.8. I thought he had a right prostate nodule on exam but at the biopsy in lateral decubitus, I got a better exam and prostate felt benign. No FH of PCa. He underwent prostate bx 01/03/2021. His PSA remain lower/stable at 5.24 Apr 2021. His Aug 2023 PSA down to 5.2. Aug 2023 exam with R>L asymmetry of prostate but no nodules, 50 g.    Biopsy: #17 December 2020 Early Intermediate risk PCa PSA 5.8 T1c (R>L asymmetry) Prostate 66 g (psad 0.09) Gleason 3+4=7 in one core, 10% (pattern 4 10%) - right apex   1/12  Staging: Mar 2024 pMRI which showed ROI#1 18 mm LEFT PIRADS 3 anterior apical FMS with capsular bulging, ROI#2 16 mm PIRADS 3 RIGHT PZ. Prostate 55 g. Staging negative apart from poss ECE with capsular bulge.    2) BPH - He is taking tamsulosin. No prostate surgery. Prostate 66 g on [place]. AUASS = 16. Occ . PVR 28 ml. Frequency, nocturia, weak stream. UA clear. He mentioned the LUTS again and noted they are more bothersome.     He returns in management of the above for prostate bx. Doing well with no dysuria or gross hematuria.    His wife died from a hip fx in 01-11-2021. She was already on HD. She got pneumonia. His wife had kTx and needed another one.    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  Prostate Biopsy Procedure   Informed consent was obtained after discussing risks/benefits of the procedure.  A time out was performed to ensure correct patient identity.  Pre-Procedure: - Last PSA Level: No results found for: "PSA" - Gentamicin given prophylactically - Levaquin 500 mg administered PO -DRE: R>L but no hard area or nodule  -Transrectal Ultrasound performed revealing a 45 - 74 gm prostate +/- the small median lobe -No significant hypoechoic area noted  Procedure: - Prostate block performed using 10 cc 1% lidocaine and NEEDLE biopsies taken from  sextant areas, a total of 12 under ultrasound guidance. Cognitive guidance for the left anterior and right PZ lesions. I made sure to get anterior on the left mid - apical biopsies on all 4.   Post-Procedure: - Patient tolerated the procedure well - He was counseled to seek immediate medical attention if experiences any severe pain, significant bleeding, or fevers - Return as planned discuss biopsy results

## 2023-01-01 ENCOUNTER — Ambulatory Visit: Payer: Medicare Other | Admitting: Urology

## 2023-01-01 ENCOUNTER — Encounter: Payer: Self-pay | Admitting: Urology

## 2023-01-01 VITALS — BP 124/79 | HR 92

## 2023-01-01 DIAGNOSIS — C61 Malignant neoplasm of prostate: Secondary | ICD-10-CM

## 2023-01-01 NOTE — Progress Notes (Unsigned)
01/01/2023 2:04 PM   Angel Chambers [birth year] [id number]  Referring provider: Mechele Claude, MD [address] [place],  [place] [address]  No chief complaint on file.   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 His PSA was 05/20 PSA 3.8 and then 05/22 PSA 5.8. I thought he had a right prostate nodule on exam but at the biopsy in lateral decubitus, I got a better exam and prostate felt benign. No FH of PCa. He underwent prostate bx 01/03/2021. His PSA remain lower/stable at 5.24 Apr 2021. His Aug 2023 PSA down to 5.2. Aug 2023 exam with R>L asymmetry of prostate but no nodules, 50 g.    Biopsy: #17 December 2020 Early Intermediate risk PCa PSA 5.8 T1c (R>L asymmetry) Prostate 66 g (psad 0.09) GG 2, one core, 10% (pattern 4 10%) - right apex   1/12  #19 December 2022 Unfavorable intermediate risk disease PSA 6.5 T1c Prostate 45 to 74 g depending on median lobe calculation GG 3, 1 core, left apical, 50% (pattern 470%) GG 2, 1 core, left apex, 5% GG 1, 1 core, left base, 5% MSK-lymph node risk 8%   Staging: Mar 2024 pMRI which showed ROI#1 18 mm LEFT PIRADS 3 anterior apical FMS with capsular bulging, ROI#2 16 mm PIRADS 3 RIGHT PZ. Prostate 55 g. Staging negative apart from poss ECE with capsular bulge.    2) BPH - He is taking tamsulosin. No prostate surgery. Prostate 66 g on [place]. AUASS = 16. Occ . PVR 28 ml. Frequency, nocturia, weak stream. UA clear. He mentioned the LUTS again and noted they are more bothersome.     He returns in management of the above.  His PSA rose to 6.22 August 2022 so we got a prostate MRI.  Prostate MRI March 2024 showed negative staging but showed a PI-RADS 3 lesion left anterior apical and the PI-RADS 3 lesion right peripheral zone.  He underwent a cognitive fusion biopsy on December 18, 2022 which confirmed unfavorable intermediate risk disease at the left apex anterior.  He presents today to discuss management.   His wife died  from a hip fx in Jan 23, 2021. She was already on HD. She got pneumonia. His wife had kTx and needed another one.    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  PMH: Past Medical History:  Diagnosis Date   History of kidney stones    Thyroid cancer [facility])     Surgical History: Past Surgical History:  Procedure Laterality Date   COLONOSCOPY N/A 12/24/2018   Procedure: COLONOSCOPY;  Surgeon: Corbin Ade, MD;  [facility];  Service: Endoscopy;  Laterality: N/A;  12:45pm   FLEXIBLE SIGMOIDOSCOPY N/A 06/18/2018   Procedure: FLEXIBLE SIGMOIDOSCOPY;  Surgeon: Corbin Ade, MD;  [facility];  Service: Endoscopy;  Laterality: N/A;   THYROIDECTOMY N/A 01/14/2018   Procedure: TOTAL THYROIDECTOMY;  Surgeon: Franky Macho, MD;  Location: [facility];  Service: General;  Laterality: N/A;   WRIST FRACTURE SURGERY Left 24-Jan-2003    Home Medications:  Allergies as of 01/01/2023   No Known Allergies      Medication List        Accurate as of January 01, 2023  2:04 PM. If you have any questions, ask your nurse or doctor.          levothyroxine 137 MCG tablet Commonly known as: SYNTHROID Take  1 tablet (137 mcg total) by mouth daily before breakfast.   tamsulosin 0.4 MG Caps capsule Commonly known as: FLOMAX Take 1 capsule (0.4 mg total) by mouth daily after supper.   Vitamin D3 50 MCG (2000 UT) Tabs Take 2,000 Units by mouth 2 (two) times a day.        Allergies: No Known Allergies  Family History: Family History  Problem Relation Age of Onset   Diabetes Mother    Kidney disease Mother    Heart attack Father    Cancer Sister        hysterectomy due to cancer   Hypertension Brother    Heart disease Brother    Thyroid disease Brother    Colon cancer Neg Hx     Social History:  reports that he has never smoked. He has never used smokeless tobacco. He reports that he does not drink alcohol and does not use drugs.   Physical Exam: BP 124/79    Pulse 92   Constitutional:  Alert and oriented, No acute distress. HEENT: [place] AT, moist mucus membranes.  Trachea midline, no masses. Cardiovascular: No clubbing, cyanosis, or edema. Respiratory: Normal respiratory effort, no increased work of breathing. GI: Abdomen is soft, nontender, nondistended, no abdominal masses GU: No CVA tenderness Lymph: No cervical or inguinal lymphadenopathy. Skin: No rashes, bruises or suspicious lesions. Neurologic: Grossly intact, no focal deficits, moving all 4 extremities. Psychiatric: Normal mood and affect.  Laboratory Data: Lab Results  Component Value Date   WBC 5.1 02/07/2022   HGB 15.1 02/07/2022   HCT 45.3 02/07/2022   MCV 94 02/07/2022   PLT 180 02/07/2022    Lab Results  Component Value Date   CREATININE 1.17 02/07/2022    No results found for: "PSA"  No results found for: "TESTOSTERONE"  No results found for: "HGBA1C"  Urinalysis    Component Value Date/Time   APPEARANCEUR Clear 12/04/2022 1539   GLUCOSEU Negative 12/04/2022 1539   BILIRUBINUR Negative 12/04/2022 1539   PROTEINUR Negative 12/04/2022 1539   UROBILINOGEN negative 11/11/2013 1641   NITRITE Negative 12/04/2022 1539   LEUKOCYTESUR Negative 12/04/2022 1539    Lab Results  Component Value Date   LABMICR Comment 12/04/2022   WBCUA None seen 05/23/2021   LABEPIT None seen 05/23/2021   BACTERIA None seen 05/23/2021    Pertinent Imaging: pMRI  Assessment & Plan:    PCa - I had a long discussion with the patient using his path report as a reference.  We went his stage, grade and prognosis and how he has had grade progression since his last biopsy.  We discussed the prognosis of GG2 vs GG3 disease. We discussed the relevant anatomy. We discussed the nature risks and benefits of active surveillance, radical prostatectomy, IMRT (+/- ADT), brachytherapy. We discussed specifically how each treatment might affect the bowel, bladder and sexual function. We discussed  how each treatment might effect salvage treatments. We discussed the role of other modalities in the treatment of prostate cancer including chemotherapy, HIFU and cryotherapy as well as focal vs whole gland treatment.   He will see Dr. Langston Masker at [facility] to discuss external beam. We also discussed gold seeds and spaceoar if he needs it and that a colleague would likely do the case. Also discussed 6 months ADT with Lupron in needed.    No follow-ups on file.  Jerilee Field, MD  [facility]  [address] [place], [place] [address] [phone]

## 2023-01-18 ENCOUNTER — Telehealth: Payer: Self-pay

## 2023-01-18 NOTE — Telephone Encounter (Signed)
Daughter called and states that patient had questions regarding next steps for his cancer treatment. Daughter asked if patient will be scheduled for gold seeds and when.

## 2023-01-19 NOTE — Telephone Encounter (Signed)
See below, please follow up with patient.

## 2023-01-22 NOTE — Telephone Encounter (Signed)
Reach out to Dr. Langston Masker office states they do not have referral, reach out to [facility] to follow up on referral. "He needs to see Dr. Langston Masker at [facility] - has that happened? If so, please get the consult note. I need to know wether Dr. Langston Masker wants ADT and if he wants gold seeds/space oar. If patient hasn't seen Dr. Langston Masker, please check on the status of the referral to Dr. Langston Masker - thanks!"

## 2023-01-24 NOTE — Telephone Encounter (Signed)
I contact Dr. Langston Masker office to check on this referral after receiving a message from Dr. Mena Goes. They advised me that they had not received a referral for this patient as of today. I was not able to get the fax to go through so the office manager gave me her email to send it to her that way. Thanks, Marcelino Duster

## 2023-01-30 DIAGNOSIS — C61 Malignant neoplasm of prostate: Secondary | ICD-10-CM | POA: Diagnosis not present

## 2023-01-30 NOTE — Telephone Encounter (Signed)
FYI, see below.

## 2023-02-08 ENCOUNTER — Encounter: Payer: Self-pay | Admitting: Family Medicine

## 2023-02-08 ENCOUNTER — Ambulatory Visit (INDEPENDENT_AMBULATORY_CARE_PROVIDER_SITE_OTHER): Payer: Medicare Other | Admitting: Family Medicine

## 2023-02-08 VITALS — BP 128/75 | HR 77 | Temp 97.1°F | Resp 20 | Ht 72.0 in | Wt 200.1 lb

## 2023-02-08 DIAGNOSIS — Z0001 Encounter for general adult medical examination with abnormal findings: Secondary | ICD-10-CM

## 2023-02-08 DIAGNOSIS — E782 Mixed hyperlipidemia: Secondary | ICD-10-CM | POA: Diagnosis not present

## 2023-02-08 DIAGNOSIS — F985 Adult onset fluency disorder: Secondary | ICD-10-CM | POA: Diagnosis not present

## 2023-02-08 DIAGNOSIS — E559 Vitamin D deficiency, unspecified: Secondary | ICD-10-CM | POA: Diagnosis not present

## 2023-02-08 DIAGNOSIS — N400 Enlarged prostate without lower urinary tract symptoms: Secondary | ICD-10-CM

## 2023-02-08 DIAGNOSIS — E038 Other specified hypothyroidism: Secondary | ICD-10-CM | POA: Diagnosis not present

## 2023-02-08 DIAGNOSIS — Z0000 Encounter for general adult medical examination without abnormal findings: Secondary | ICD-10-CM

## 2023-02-08 MED ORDER — LEVOTHYROXINE SODIUM 137 MCG PO TABS: 137.0000 ug | ORAL_TABLET | Freq: Every day | ORAL | 0 refills | Status: DC

## 2023-02-08 NOTE — Progress Notes (Signed)
Subjective:  Patient ID: Angel Chambers, male    DOB: 21-May-1956  Age: 67 y.o. MRN: [id number]  CC: Annual Exam   HPI Angel Chambers presents for Annual physical. Recently dx with Ca Prostate. Having surgery soon for prostatectomy. Was determined not to be a candidate for chemo.   follow-up on  thyroid. The patient has a history of hypothyroidism for many years. It has been stable recently. Pt. denies any change in  voice, loss of hair, heat or cold intolerance. Energy level has been adequate to good. Patient denies constipation and diarrhea. No myxedema. Medication is as noted below. Verified that pt is taking it daily on an empty stomach. Well tolerated.  Not taking medication for cholesterol. Watching fat in the diet.       02/08/2023    3:52 PM 11/29/2022    3:44 PM 02/07/2022   12:49 PM  Depression screen PHQ 2/9  Decreased Interest 0 0 0  Down, Depressed, Hopeless 0 0 0  PHQ - 2 Score 0 0 0  Altered sleeping 0    Tired, decreased energy 0    Change in appetite 0    Feeling bad or failure about yourself  0    Trouble concentrating 0    Moving slowly or fidgety/restless 0    Suicidal thoughts 0    PHQ-9 Score 0      History Angel Chambers has a past medical history of History of kidney stones, S/P total thyroidectomy (01/14/2018), and Thyroid cancer (HCC).   He has a past surgical history that includes Wrist fracture surgery (Left, 2004); Thyroidectomy (N/A, 01/14/2018); Flexible sigmoidoscopy (N/A, 06/18/2018); and Colonoscopy (N/A, 12/24/2018).   His family history includes Cancer in his sister; Diabetes in his mother; Heart attack in his father; Heart disease in his brother; Hypertension in his brother; Kidney disease in his mother; Thyroid disease in his brother.He reports that he has never smoked. He has never used smokeless tobacco. He reports that he does not drink alcohol and does not use drugs.    ROS Review of Systems  Constitutional:  Negative for activity change, fatigue and  unexpected weight change.  HENT:  Negative for congestion, ear pain, hearing loss, postnasal drip and trouble swallowing.   Eyes:  Negative for pain and visual disturbance.  Respiratory:  Negative for cough, chest tightness and shortness of breath.   Cardiovascular:  Negative for chest pain, palpitations and leg swelling.  Gastrointestinal:  Negative for abdominal distention, abdominal pain, blood in stool, constipation, diarrhea, nausea and vomiting.  Endocrine: Negative for cold intolerance, heat intolerance and polydipsia.  Genitourinary:  Negative for difficulty urinating, dysuria, flank pain, frequency and urgency.  Musculoskeletal:  Negative for arthralgias and joint swelling.  Skin:  Negative for color change, rash and wound.  Neurological:  Negative for dizziness, syncope, speech difficulty, weakness, light-headedness, numbness and headaches.  Hematological:  Does not bruise/bleed easily.  Psychiatric/Behavioral:  Negative for confusion, decreased concentration, dysphoric mood and sleep disturbance. The patient is not nervous/anxious.     Objective:  BP 128/75   Pulse 77   Temp (!) 97.1 F (36.2 C) (Oral)   Resp 20   Ht 6' (1.829 m)   Wt 200 lb 2 oz (90.8 kg)   SpO2 97%   BMI 27.14 kg/m   BP Readings from Last 3 Encounters:  02/08/23 128/75  01/01/23 124/79  12/18/22 (!) 151/89    Wt Readings from Last 3 Encounters:  02/08/23 200 lb 2 oz (90.8 kg)  11/29/22 190 lb (86.2 kg)  09/04/22 195 lb 12.8 oz (88.8 kg)     Physical Exam Vitals reviewed.  Constitutional:      Appearance: He is well-developed.  HENT:     Head: Normocephalic and atraumatic.     Right Ear: External ear normal.     Left Ear: External ear normal.     Mouth/Throat:     Pharynx: No oropharyngeal exudate or posterior oropharyngeal erythema.  Eyes:     Pupils: Pupils are equal, round, and reactive to light.  Neck:     Thyroid: No thyromegaly.     Trachea: No tracheal deviation.   Cardiovascular:     Rate and Rhythm: Normal rate and regular rhythm.     Heart sounds: Normal heart sounds. No murmur heard.    No friction rub. No gallop.  Pulmonary:     Effort: No respiratory distress.     Breath sounds: Normal breath sounds. No wheezing or rales.  Abdominal:     General: Bowel sounds are normal. There is no distension.     Palpations: Abdomen is soft. There is no mass.     Tenderness: There is no abdominal tenderness.     Hernia: There is no hernia in the left inguinal area.  Genitourinary:    Penis: Normal.      Testes: Normal.  Musculoskeletal:        General: Normal range of motion.     Cervical back: Normal range of motion and neck supple.  Lymphadenopathy:     Cervical: No cervical adenopathy.  Skin:    General: Skin is warm and dry.  Neurological:     Mental Status: He is alert and oriented to person, place, and time.     Comments: Actively stuttering with nml conversation.       Assessment & Plan:   Dorris was seen today for annual exam.  Diagnoses and all orders for this visit:  Well adult exam  Adult stuttering  Benign prostatic hyperplasia without lower urinary tract symptoms -     CBC with Differential/Platelet -     CMP14+EGFR -     Lipid panel -     TSH + free T4 -     VITAMIN D 25 Hydroxy (Vit-D Deficiency, Fractures) -     Urinalysis  Other specified hypothyroidism -     CBC with Differential/Platelet -     CMP14+EGFR -     Lipid panel -     TSH + free T4 -     VITAMIN D 25 Hydroxy (Vit-D Deficiency, Fractures) -     Urinalysis  Vitamin D deficiency -     CBC with Differential/Platelet -     CMP14+EGFR -     VITAMIN D 25 Hydroxy (Vit-D Deficiency, Fractures)  Mixed hyperlipidemia -     CBC with Differential/Platelet -     CMP14+EGFR -     Lipid panel  Other orders -     levothyroxine (SYNTHROID) 137 MCG tablet; Take 1 tablet (137 mcg total) by mouth daily before breakfast.       I am having [name]  maintain his Vitamin D3, tamsulosin, and levothyroxine.  Allergies as of 02/08/2023   No Known Allergies      Medication List        Accurate as of February 08, 2023  4:45 PM. If you have any questions, ask your nurse or doctor.  levothyroxine 137 MCG tablet Commonly known as: SYNTHROID Take 1 tablet (137 mcg total) by mouth daily before breakfast.   tamsulosin 0.4 MG Caps capsule Commonly known as: FLOMAX Take 1 capsule (0.4 mg total) by mouth daily after supper.   Vitamin D3 50 MCG (2000 UT) Tabs Take 2,000 Units by mouth 2 (two) times a day.         Follow-up: Return in about 6 months (around 08/11/2023).  Mechele Claude, M.D.

## 2023-02-09 LAB — CMP14+EGFR
ALT: 18 IU/L (ref 0–44)
AST: 21 IU/L (ref 0–40)
Albumin: 4.3 g/dL (ref 3.9–4.9)
Alkaline Phosphatase: 88 IU/L (ref 44–121)
BUN/Creatinine Ratio: 14 (ref 10–24)
BUN: 16 mg/dL (ref 8–27)
Bilirubin Total: 0.4 mg/dL (ref 0.0–1.2)
CO2: 25 mmol/L (ref 20–29)
Calcium: 9.7 mg/dL (ref 8.6–10.2)
Chloride: 104 mmol/L (ref 96–106)
Creatinine, Ser: 1.16 mg/dL (ref 0.76–1.27)
Globulin, Total: 2.3 g/dL (ref 1.5–4.5)
Glucose: 92 mg/dL (ref 70–99)
Potassium: 4.2 mmol/L (ref 3.5–5.2)
Sodium: 142 mmol/L (ref 134–144)
Total Protein: 6.6 g/dL (ref 6.0–8.5)
eGFR: 69 mL/min/{1.73_m2} (ref >59)

## 2023-02-09 LAB — CBC WITH DIFFERENTIAL/PLATELET
Basophils Absolute: 0 10*3/uL (ref 0.0–0.2)
Basos: 1 %
EOS (ABSOLUTE): 0.1 10*3/uL (ref 0.0–0.4)
Eos: 2 %
Hematocrit: 45.5 % (ref 37.5–51.0)
Hemoglobin: 15.2 g/dL (ref 13.0–17.7)
Immature Grans (Abs): 0 10*3/uL (ref 0.0–0.1)
Immature Granulocytes: 0 %
Lymphocytes Absolute: 1.9 10*3/uL (ref 0.7–3.1)
Lymphs: 31 %
MCH: 32.7 pg (ref 26.6–33.0)
MCHC: 33.4 g/dL (ref 31.5–35.7)
MCV: 98 fL — ABNORMAL HIGH (ref 79–97)
Monocytes Absolute: 0.5 10*3/uL (ref 0.1–0.9)
Monocytes: 9 %
Neutrophils Absolute: 3.5 10*3/uL (ref 1.4–7.0)
Neutrophils: 57 %
Platelets: 203 10*3/uL (ref 150–450)
RBC: 4.65 x10E6/uL (ref 4.14–5.80)
RDW: 13 % (ref 11.6–15.4)
WBC: 6 10*3/uL (ref 3.4–10.8)

## 2023-02-09 LAB — LIPID PANEL
Chol/HDL Ratio: 4.4 ratio (ref 0.0–5.0)
Cholesterol, Total: 230 mg/dL — ABNORMAL HIGH (ref 100–199)
HDL: 52 mg/dL (ref >39)
LDL Chol Calc (NIH): 118 mg/dL — ABNORMAL HIGH (ref 0–99)
Triglycerides: 344 mg/dL — ABNORMAL HIGH (ref 0–149)
VLDL Cholesterol Cal: 60 mg/dL — ABNORMAL HIGH (ref 5–40)

## 2023-02-09 LAB — TSH+FREE T4
Free T4: 1.58 ng/dL (ref 0.82–1.77)
TSH: 0.762 u[IU]/mL (ref 0.450–4.500)

## 2023-02-09 LAB — URINALYSIS
Bilirubin, UA: NEGATIVE
Glucose, UA: NEGATIVE
Ketones, UA: NEGATIVE
Leukocytes,UA: NEGATIVE
Nitrite, UA: NEGATIVE
Protein,UA: NEGATIVE
RBC, UA: NEGATIVE
Specific Gravity, UA: 1.027 (ref 1.005–1.030)
Urobilinogen, Ur: 1 mg/dL (ref 0.2–1.0)
pH, UA: 6 (ref 5.0–7.5)

## 2023-02-09 LAB — VITAMIN D 25 HYDROXY (VIT D DEFICIENCY, FRACTURES): Vit D, 25-Hydroxy: 63 ng/mL (ref 30.0–100.0)

## 2023-02-27 ENCOUNTER — Other Ambulatory Visit: Payer: Self-pay | Admitting: Urology

## 2023-02-27 ENCOUNTER — Telehealth: Payer: Self-pay | Admitting: Urology

## 2023-02-27 DIAGNOSIS — C61 Malignant neoplasm of prostate: Secondary | ICD-10-CM

## 2023-02-27 NOTE — Telephone Encounter (Signed)
Patient daughter called they want to proceed with the surgery, they do not want to do the chemo because the cancer is 2 inches into his prostate and they are worried about it moving to the bladder?

## 2023-02-28 NOTE — Telephone Encounter (Signed)
Open in error

## 2023-03-01 NOTE — Addendum Note (Signed)
Addended by: Ferdinand Lango on: 03/01/2023 11:29 AM   Modules accepted: Orders

## 2023-03-09 DIAGNOSIS — C61 Malignant neoplasm of prostate: Secondary | ICD-10-CM | POA: Diagnosis not present

## 2023-03-12 ENCOUNTER — Other Ambulatory Visit: Payer: Self-pay | Admitting: Urology

## 2023-03-14 DIAGNOSIS — C61 Malignant neoplasm of prostate: Secondary | ICD-10-CM | POA: Diagnosis not present

## 2023-03-14 DIAGNOSIS — M6281 Muscle weakness (generalized): Secondary | ICD-10-CM | POA: Diagnosis not present

## 2023-03-21 NOTE — Progress Notes (Signed)
COVID Vaccine Completed: yes  Date of COVID positive in last 90 days:  PCP - Mechele Claude, MD Cardiologist -   Chest x-ray -  EKG -  Stress Test -  ECHO -  Cardiac Cath -  Pacemaker/ICD device last checked: Spinal Cord Stimulator:  Bowel Prep -   Sleep Study -  CPAP -   Fasting Blood Sugar -  Checks Blood Sugar _____ times a day  Last dose of GLP1 agonist-  N/A GLP1 instructions:  N/A   Last dose of SGLT-2 inhibitors-  N/A SGLT-2 instructions: N/A   Blood Thinner Instructions:  Time Aspirin Instructions: Last Dose:  Activity level:  Can go up a flight of stairs and perform activities of daily living without stopping and without symptoms of chest pain or shortness of breath.  Able to exercise without symptoms  Unable to go up a flight of stairs without symptoms of     Anesthesia review:   Patient denies shortness of breath, fever, cough and chest pain at PAT appointment  Patient verbalized understanding of instructions that were given to them at the PAT appointment. Patient was also instructed that they will need to review over the PAT instructions again at home before surgery.

## 2023-03-21 NOTE — Patient Instructions (Signed)
SURGICAL WAITING ROOM VISITATION  Patients having surgery or a procedure may have no more than 2 support people in the waiting area - these visitors may rotate.    Children under the age of 73 must have an adult with them who is not the patient.  Due to an increase in RSV and influenza rates and associated hospitalizations, children ages 22 and under may not visit patients in [facility] hospitals.  If the patient needs to stay at the hospital during part of their recovery, the visitor guidelines for inpatient rooms apply. Pre-op nurse will coordinate an appropriate time for 1 support person to accompany patient in pre-op.  This support person may not rotate.    Please refer to the [facility] website for the visitor guidelines for Inpatients (after your surgery is over and you are in a regular room).    Your procedure is scheduled on: 03/29/23   Report to [facility] Main Entrance    Report to admitting at 6:00 AM   Call this number if you have problems the morning of surgery [phone]   Follow a clear liquid diet the day before surgery.  Water Non-Citrus Juices (without pulp, NO RED-Apple, White grape, White cranberry) Black Coffee (NO MILK/CREAM OR CREAMERS, sugar ok)  Clear Tea (NO MILK/CREAM OR CREAMERS, sugar ok) regular and decaf                             Plain Jell-O (NO RED)                                           Fruit ices (not with fruit pulp, NO RED)                                     Popsicles (NO RED)                                                               Sports drinks like Gatorade (NO RED)              Nothing to drink after midnight.         If you have questions, please contact your surgeon's office.   FOLLOW BOWEL PREP AND ANY ADDITIONAL PRE OP INSTRUCTIONS YOU RECEIVED FROM YOUR SURGEON'S OFFICE!!!     Oral Hygiene is also important to reduce your risk of infection.                                    Remember - BRUSH YOUR TEETH THE  MORNING OF SURGERY WITH YOUR REGULAR TOOTHPASTE  DENTURES WILL BE REMOVED PRIOR TO SURGERY PLEASE DO NOT APPLY "Poly grip" OR ADHESIVES!!!   Stop all vitamins and herbal supplements 7 days before surgery.   Take these medicines the morning of surgery with A SIP OF WATER: Levothyroxine  You may not have any metal on your body including jewelry, and body piercing             Do not wear lotions, powders, cologne, or deodorant              Men may shave face and neck.   Do not bring valuables to the hospital. [facility] IS NOT             RESPONSIBLE   FOR VALUABLES.   Contacts, glasses, dentures or bridgework may not be worn into surgery.   Bring small overnight bag day of surgery.   DO NOT BRING YOUR HOME MEDICATIONS TO THE HOSPITAL. PHARMACY WILL DISPENSE MEDICATIONS LISTED ON YOUR MEDICATION LIST TO YOU DURING YOUR ADMISSION IN THE HOSPITAL!    Special Instructions: Bring a copy of your healthcare power of attorney and living will documents the day of surgery if you haven't scanned them before.              Please read over the following fact sheets you were given: IF YOU HAVE QUESTIONS ABOUT YOUR PRE-OP INSTRUCTIONS PLEASE CALL [phone] [place]ras   If you received a COVID test during your pre-op visit  it is requested that you wear a mask when out in public, stay away from anyone that may not be feeling well and notify your surgeon if you develop symptoms. If you test positive for Covid or have been in contact with anyone that has tested positive in the last 10 days please notify you surgeon.    [facility] - Preparing for Surgery Before surgery, you can play an important role.  Because skin is not sterile, your skin needs to be as free of germs as possible.  You can reduce the number of germs on your skin by washing with CHG (chlorahexidine gluconate) soap before surgery.  CHG is an antiseptic cleaner which kills germs and bonds with the skin to  continue killing germs even after washing. Please DO NOT use if you have an allergy to CHG or antibacterial soaps.  If your skin becomes reddened/irritated stop using the CHG and inform your nurse when you arrive at Short Stay. Do not shave (including legs and underarms) for at least 48 hours prior to the first CHG shower.  You may shave your face/neck.  Please follow these instructions carefully:  1.  Shower with CHG Soap the night before surgery and the  morning of surgery.  2.  If you choose to wash your hair, wash your hair first as usual with your normal  shampoo.  3.  After you shampoo, rinse your hair and body thoroughly to remove the shampoo.                             4.  Use CHG as you would any other liquid soap.  You can apply chg directly to the skin and wash.  Gently with a scrungie or clean washcloth.  5.  Apply the CHG Soap to your body ONLY FROM THE NECK DOWN.   Do   not use on face/ open                           Wound or open sores. Avoid contact with eyes, ears mouth and   genitals (private parts).  Wash face,  Genitals (private parts) with your normal soap.             6.  Wash thoroughly, paying special attention to the area where your    surgery  will be performed.  7.  Thoroughly rinse your body with warm water from the neck down.  8.  DO NOT shower/wash with your normal soap after using and rinsing off the CHG Soap.                9.  Pat yourself dry with a clean towel.            10.  Wear clean pajamas.            11.  Place clean sheets on your bed the night of your first shower and do not  sleep with pets. Day of Surgery : Do not apply any lotions/deodorants the morning of surgery.  Please wear clean clothes to the hospital/surgery center.  FAILURE TO FOLLOW THESE INSTRUCTIONS MAY RESULT IN THE CANCELLATION OF YOUR SURGERY  PATIENT SIGNATURE_________________________________  NURSE  SIGNATURE__________________________________  ________________________________________________________________________ WHAT IS A BLOOD TRANSFUSION? Blood Transfusion Information  A transfusion is the replacement of blood or some of its parts. Blood is made up of multiple cells which provide different functions. Red blood cells carry oxygen and are used for blood loss replacement. White blood cells fight against infection. Platelets control bleeding. Plasma helps clot blood. Other blood products are available for specialized needs, such as hemophilia or other clotting disorders. BEFORE THE TRANSFUSION  Who gives blood for transfusions?  Healthy volunteers who are fully evaluated to make sure their blood is safe. This is blood bank blood. Transfusion therapy is the safest it has ever been in the practice of medicine. Before blood is taken from a donor, a complete history is taken to make sure that person has no history of diseases nor engages in risky social behavior (examples are intravenous drug use or sexual activity with multiple partners). The donor's travel history is screened to minimize risk of transmitting infections, such as malaria. The donated blood is tested for signs of infectious diseases, such as HIV and hepatitis. The blood is then tested to be sure it is compatible with you in order to minimize the chance of a transfusion reaction. If you or a relative donates blood, this is often done in anticipation of surgery and is not appropriate for emergency situations. It takes many days to process the donated blood. RISKS AND COMPLICATIONS Although transfusion therapy is very safe and saves many lives, the main dangers of transfusion include:  Getting an infectious disease. Developing a transfusion reaction. This is an allergic reaction to something in the blood you were given. Every precaution is taken to prevent this. The decision to have a blood transfusion has been considered carefully  by your caregiver before blood is given. Blood is not given unless the benefits outweigh the risks. AFTER THE TRANSFUSION Right after receiving a blood transfusion, you will usually feel much better and more energetic. This is especially true if your red blood cells have gotten low (anemic). The transfusion raises the level of the red blood cells which carry oxygen, and this usually causes an energy increase. The nurse administering the transfusion will monitor you carefully for complications. HOME CARE INSTRUCTIONS  No special instructions are needed after a transfusion. You may find your energy is better. Speak with your caregiver about any limitations on activity for underlying diseases you may  have. SEEK MEDICAL CARE IF:  Your condition is not improving after your transfusion. You develop redness or irritation at the intravenous (IV) site. SEEK IMMEDIATE MEDICAL CARE IF:  Any of the following symptoms occur over the next 12 hours: Shaking chills. You have a temperature by mouth above 102 F (38.9 C), not controlled by medicine. Chest, back, or muscle pain. People around you feel you are not acting correctly or are confused. Shortness of breath or difficulty breathing. Dizziness and fainting. You get a rash or develop hives. You have a decrease in urine output. Your urine turns a dark color or changes to pink, red, or brown. Any of the following symptoms occur over the next 10 days: You have a temperature by mouth above 102 F (38.9 C), not controlled by medicine. Shortness of breath. Weakness after normal activity. The white part of the eye turns yellow (jaundice). You have a decrease in the amount of urine or are urinating less often. Your urine turns a dark color or changes to pink, red, or brown. Document Released: 06/02/2000 Document Revised: 08/28/2011 Document Reviewed: 01/20/2008 [facility] Patient Information 2014 [place],  [place].  _______________________________________________________________________

## 2023-03-22 ENCOUNTER — Encounter (HOSPITAL_COMMUNITY)
Admission: RE | Admit: 2023-03-22 | Discharge: 2023-03-22 | Disposition: A | Discharge status: Home / Self Care | Payer: Medicare Other | Source: Ambulatory Visit | Attending: Urology | Admitting: Urology

## 2023-03-22 ENCOUNTER — Other Ambulatory Visit: Payer: Self-pay

## 2023-03-22 ENCOUNTER — Encounter (HOSPITAL_COMMUNITY): Payer: Self-pay

## 2023-03-22 VITALS — BP 136/95 | HR 70 | Temp 98.4°F | Resp 16 | Ht 72.0 in | Wt 194.0 lb

## 2023-03-22 DIAGNOSIS — Z01810 Encounter for preprocedural cardiovascular examination: Secondary | ICD-10-CM | POA: Diagnosis not present

## 2023-03-22 DIAGNOSIS — Z01812 Encounter for preprocedural laboratory examination: Secondary | ICD-10-CM | POA: Insufficient documentation

## 2023-03-22 DIAGNOSIS — M6281 Muscle weakness (generalized): Secondary | ICD-10-CM | POA: Diagnosis not present

## 2023-03-22 DIAGNOSIS — N393 Stress incontinence (female) (male): Secondary | ICD-10-CM | POA: Diagnosis not present

## 2023-03-22 DIAGNOSIS — Z1389 Encounter for screening for other disorder: Secondary | ICD-10-CM | POA: Diagnosis not present

## 2023-03-22 DIAGNOSIS — M62838 Other muscle spasm: Secondary | ICD-10-CM | POA: Diagnosis not present

## 2023-03-22 DIAGNOSIS — Z79899 Other long term (current) drug therapy: Secondary | ICD-10-CM | POA: Diagnosis not present

## 2023-03-22 DIAGNOSIS — Z01818 Encounter for other preprocedural examination: Secondary | ICD-10-CM

## 2023-03-22 LAB — BASIC METABOLIC PANEL
Anion gap: 8 (ref 5–15)
BUN: 16 mg/dL (ref 8–23)
CO2: 24 mmol/L (ref 22–32)
Calcium: 9 mg/dL (ref 8.9–10.3)
Chloride: 108 mmol/L (ref 98–111)
Creatinine, Ser: 0.9 mg/dL (ref 0.61–1.24)
GFR, Estimated: >60 mL/min (ref >60)
Glucose, Bld: 101 mg/dL — ABNORMAL HIGH (ref 70–99)
Potassium: 3.7 mmol/L (ref 3.5–5.1)
Sodium: 140 mmol/L (ref 135–145)

## 2023-03-22 LAB — CBC
HCT: 47 % (ref 39.0–52.0)
Hemoglobin: 15.6 g/dL (ref 13.0–17.0)
MCH: 32.1 pg (ref 26.0–34.0)
MCHC: 33.2 g/dL (ref 30.0–36.0)
MCV: 96.7 fL (ref 80.0–100.0)
Platelets: 186 10*3/uL (ref 150–400)
RBC: 4.86 MIL/uL (ref 4.22–5.81)
RDW: 12.7 % (ref 11.5–15.5)
WBC: 5.7 10*3/uL (ref 4.0–10.5)
nRBC: 0 % (ref 0.0–0.2)

## 2023-03-23 LAB — URINE CULTURE: Culture: NO GROWTH

## 2023-03-28 ENCOUNTER — Telehealth: Payer: Self-pay

## 2023-03-28 NOTE — Telephone Encounter (Signed)
Patient left paperwork to be completed for upcoming surgery. Patient paid $20 cash for forms to be completed.

## 2023-03-29 ENCOUNTER — Encounter (HOSPITAL_COMMUNITY)
Admission: RE | Disposition: A | Discharge status: Home / Self Care | Payer: Self-pay | Source: Home / Self Care | Attending: Urology

## 2023-03-29 ENCOUNTER — Other Ambulatory Visit: Payer: Self-pay

## 2023-03-29 ENCOUNTER — Ambulatory Visit (HOSPITAL_BASED_OUTPATIENT_CLINIC_OR_DEPARTMENT_OTHER): Payer: Self-pay | Admitting: Certified Registered"

## 2023-03-29 ENCOUNTER — Encounter (HOSPITAL_COMMUNITY): Payer: Self-pay | Admitting: Urology

## 2023-03-29 ENCOUNTER — Inpatient Hospital Stay (HOSPITAL_COMMUNITY)
Admission: RE | Admit: 2023-03-29 | Discharge: 2023-03-31 | DRG: 708 | Disposition: A | Discharge status: Home / Self Care | Payer: Medicare Other | Attending: Urology | Admitting: Urology

## 2023-03-29 ENCOUNTER — Ambulatory Visit (HOSPITAL_COMMUNITY): Payer: Medicare Other | Admitting: Physician Assistant

## 2023-03-29 DIAGNOSIS — R3916 Straining to void: Secondary | ICD-10-CM | POA: Diagnosis present

## 2023-03-29 DIAGNOSIS — E890 Postprocedural hypothyroidism: Secondary | ICD-10-CM | POA: Diagnosis present

## 2023-03-29 DIAGNOSIS — C61 Malignant neoplasm of prostate: Secondary | ICD-10-CM

## 2023-03-29 DIAGNOSIS — Z79890 Hormone replacement therapy (postmenopausal): Secondary | ICD-10-CM | POA: Diagnosis not present

## 2023-03-29 DIAGNOSIS — N401 Enlarged prostate with lower urinary tract symptoms: Secondary | ICD-10-CM | POA: Diagnosis not present

## 2023-03-29 DIAGNOSIS — R3915 Urgency of urination: Secondary | ICD-10-CM | POA: Diagnosis present

## 2023-03-29 HISTORY — PX: PELVIC LYMPH NODE DISSECTION: SHX6543

## 2023-03-29 HISTORY — PX: ROBOT ASSISTED LAPAROSCOPIC RADICAL PROSTATECTOMY: SHX5141

## 2023-03-29 LAB — TYPE AND SCREEN
ABO/RH(D): O POS
Antibody Screen: NEGATIVE

## 2023-03-29 LAB — HEMOGLOBIN AND HEMATOCRIT, BLOOD
HCT: 42.7 % (ref 39.0–52.0)
Hemoglobin: 14.5 g/dL (ref 13.0–17.0)

## 2023-03-29 SURGERY — PROSTATECTOMY, RADICAL, ROBOT-ASSISTED, LAPAROSCOPIC
Anesthesia: General

## 2023-03-29 MED ORDER — ROCURONIUM BROMIDE 10 MG/ML (PF) SYRINGE
PREFILLED_SYRINGE | INTRAVENOUS | Status: AC
  Filled 2023-03-29: qty 10

## 2023-03-29 MED ORDER — STERILE WATER FOR IRRIGATION IR SOLN
Status: DC | PRN
Start: 2023-03-29 — End: 2023-03-29
  Administered 2023-03-29: 1000 mL

## 2023-03-29 MED ORDER — FLEET ENEMA RE ENEM: 1.0000 | ENEMA | Freq: Once | RECTAL | Status: DC

## 2023-03-29 MED ORDER — DEXMEDETOMIDINE HCL IN NACL 80 MCG/20ML IV SOLN
INTRAVENOUS | Status: DC | PRN
Start: 2023-03-29 — End: 2023-03-29
  Administered 2023-03-29: 4 ug via INTRAVENOUS

## 2023-03-29 MED ORDER — DOCUSATE SODIUM 100 MG PO CAPS
100.0000 mg | ORAL_CAPSULE | Freq: Two times a day (BID) | ORAL | Status: DC
  Administered 2023-03-29 – 2023-03-31 (×4): 100 mg via ORAL
  Filled 2023-03-29 (×4): qty 1

## 2023-03-29 MED ORDER — ONDANSETRON HCL 4 MG/2ML IJ SOLN
INTRAMUSCULAR | Status: DC | PRN
  Administered 2023-03-29: 4 mg via INTRAVENOUS

## 2023-03-29 MED ORDER — TRIPLE ANTIBIOTIC 3.5-400-5000 EX OINT: 1.0000 | TOPICAL_OINTMENT | Freq: Three times a day (TID) | CUTANEOUS | Status: DC | PRN

## 2023-03-29 MED ORDER — LEVOTHYROXINE SODIUM 25 MCG PO TABS
137.0000 ug | ORAL_TABLET | Freq: Every day | ORAL | Status: DC
  Administered 2023-03-30 – 2023-03-31 (×2): 137 ug via ORAL
  Filled 2023-03-29 (×2): qty 1

## 2023-03-29 MED ORDER — LIDOCAINE 2% (20 MG/ML) 5 ML SYRINGE
INTRAMUSCULAR | Status: DC | PRN
  Administered 2023-03-29: 40 mg via INTRAVENOUS

## 2023-03-29 MED ORDER — ACETAMINOPHEN 500 MG PO TABS
1000.0000 mg | ORAL_TABLET | Freq: Four times a day (QID) | ORAL | Status: AC
  Administered 2023-03-29 – 2023-03-30 (×4): 1000 mg via ORAL
  Filled 2023-03-29 (×4): qty 2

## 2023-03-29 MED ORDER — LACTATED RINGERS IV SOLN: INTRAVENOUS | Status: DC

## 2023-03-29 MED ORDER — BUPIVACAINE LIPOSOME 1.3 % IJ SUSP
INTRAMUSCULAR | Status: DC | PRN
  Administered 2023-03-29: 20 mL

## 2023-03-29 MED ORDER — DOCUSATE SODIUM 100 MG PO CAPS: 100.0000 mg | ORAL_CAPSULE | Freq: Two times a day (BID) | ORAL | Status: AC

## 2023-03-29 MED ORDER — LIDOCAINE HCL (PF) 2 % IJ SOLN
INTRAMUSCULAR | Status: AC
  Filled 2023-03-29: qty 5

## 2023-03-29 MED ORDER — OXYCODONE HCL 5 MG/5ML PO SOLN: 5.0000 mg | Freq: Once | ORAL | Status: DC | PRN

## 2023-03-29 MED ORDER — GLYCOPYRROLATE 0.2 MG/ML IJ SOLN
INTRAMUSCULAR | Status: DC | PRN
Start: 2023-03-29 — End: 2023-03-29
  Administered 2023-03-29 (×2): .1 mg via INTRAVENOUS

## 2023-03-29 MED ORDER — SODIUM CHLORIDE (PF) 0.9 % IJ SOLN
INTRAMUSCULAR | Status: AC
  Filled 2023-03-29: qty 20

## 2023-03-29 MED ORDER — PROPOFOL 10 MG/ML IV BOLUS
INTRAVENOUS | Status: DC | PRN
  Administered 2023-03-29: 180 mg via INTRAVENOUS

## 2023-03-29 MED ORDER — AMISULPRIDE (ANTIEMETIC) 5 MG/2ML IV SOLN: 10.0000 mg | Freq: Once | INTRAVENOUS | Status: DC | PRN

## 2023-03-29 MED ORDER — DEXTROSE-SODIUM CHLORIDE 5-0.45 % IV SOLN: INTRAVENOUS | Status: DC

## 2023-03-29 MED ORDER — BUPIVACAINE-EPINEPHRINE 0.25% -1:200000 IJ SOLN
INTRAMUSCULAR | Status: AC
  Filled 2023-03-29: qty 1

## 2023-03-29 MED ORDER — EPHEDRINE SULFATE-NACL 50-0.9 MG/10ML-% IV SOSY
PREFILLED_SYRINGE | INTRAVENOUS | Status: DC | PRN
  Administered 2023-03-29: 10 mg via INTRAVENOUS
  Administered 2023-03-29 (×3): 5 mg via INTRAVENOUS

## 2023-03-29 MED ORDER — HYDROMORPHONE HCL 1 MG/ML IJ SOLN
0.2500 mg | INTRAMUSCULAR | Status: DC | PRN
  Administered 2023-03-29 (×2): 0.5 mg via INTRAVENOUS

## 2023-03-29 MED ORDER — TRAMADOL HCL 50 MG PO TABS
50.0000 mg | ORAL_TABLET | Freq: Four times a day (QID) | ORAL | 0 refills | Status: DC | PRN
Start: 2023-03-29 — End: 2024-02-06

## 2023-03-29 MED ORDER — TRAMADOL HCL 50 MG PO TABS: 50.0000 mg | ORAL_TABLET | Freq: Four times a day (QID) | ORAL | Status: DC | PRN

## 2023-03-29 MED ORDER — MIDAZOLAM HCL 2 MG/2ML IJ SOLN
INTRAMUSCULAR | Status: DC | PRN
  Administered 2023-03-29: 2 mg via INTRAVENOUS

## 2023-03-29 MED ORDER — FENTANYL CITRATE (PF) 100 MCG/2ML IJ SOLN
INTRAMUSCULAR | Status: DC | PRN
  Administered 2023-03-29: 100 ug via INTRAVENOUS

## 2023-03-29 MED ORDER — DEXAMETHASONE SODIUM PHOSPHATE 10 MG/ML IJ SOLN
INTRAMUSCULAR | Status: DC | PRN
  Administered 2023-03-29: 4 mg via INTRAVENOUS

## 2023-03-29 MED ORDER — FENTANYL CITRATE (PF) 100 MCG/2ML IJ SOLN
INTRAMUSCULAR | Status: AC
  Filled 2023-03-29: qty 2

## 2023-03-29 MED ORDER — CEFAZOLIN SODIUM-DEXTROSE 2-4 GM/100ML-% IV SOLN
2.0000 g | INTRAVENOUS | Status: AC
  Administered 2023-03-29 (×2): 2 g via INTRAVENOUS
  Filled 2023-03-29: qty 100

## 2023-03-29 MED ORDER — DEXAMETHASONE SODIUM PHOSPHATE 10 MG/ML IJ SOLN
INTRAMUSCULAR | Status: AC
  Filled 2023-03-29: qty 1

## 2023-03-29 MED ORDER — KETOROLAC TROMETHAMINE 15 MG/ML IJ SOLN
15.0000 mg | Freq: Four times a day (QID) | INTRAMUSCULAR | Status: DC
  Administered 2023-03-29 – 2023-03-31 (×7): 15 mg via INTRAVENOUS
  Filled 2023-03-29 (×7): qty 1

## 2023-03-29 MED ORDER — ROCURONIUM BROMIDE 10 MG/ML (PF) SYRINGE
PREFILLED_SYRINGE | INTRAVENOUS | Status: DC | PRN
  Administered 2023-03-29: 20 mg via INTRAVENOUS
  Administered 2023-03-29: 80 mg via INTRAVENOUS
  Administered 2023-03-29 (×2): 20 mg via INTRAVENOUS
  Administered 2023-03-29: 5 mg via INTRAVENOUS
  Administered 2023-03-29: 20 mg via INTRAVENOUS

## 2023-03-29 MED ORDER — ONDANSETRON HCL 4 MG/2ML IJ SOLN: 4.0000 mg | Freq: Once | INTRAMUSCULAR | Status: DC | PRN

## 2023-03-29 MED ORDER — DIPHENHYDRAMINE HCL 50 MG/ML IJ SOLN: 12.5000 mg | Freq: Four times a day (QID) | INTRAMUSCULAR | Status: DC | PRN

## 2023-03-29 MED ORDER — SUGAMMADEX SODIUM 200 MG/2ML IV SOLN
INTRAVENOUS | Status: DC | PRN
  Administered 2023-03-29: 200 mg via INTRAVENOUS

## 2023-03-29 MED ORDER — ORAL CARE MOUTH RINSE: 15.0000 mL | Freq: Once | OROMUCOSAL | Status: AC

## 2023-03-29 MED ORDER — BUPIVACAINE LIPOSOME 1.3 % IJ SUSP
INTRAMUSCULAR | Status: AC
  Filled 2023-03-29: qty 20

## 2023-03-29 MED ORDER — PROPOFOL 10 MG/ML IV BOLUS
INTRAVENOUS | Status: AC
  Filled 2023-03-29: qty 20

## 2023-03-29 MED ORDER — HYOSCYAMINE SULFATE 0.125 MG SL SUBL
0.1250 mg | SUBLINGUAL_TABLET | SUBLINGUAL | Status: DC | PRN
  Administered 2023-03-29: 0.125 mg via SUBLINGUAL
  Filled 2023-03-29: qty 1

## 2023-03-29 MED ORDER — SULFAMETHOXAZOLE-TRIMETHOPRIM 800-160 MG PO TABS: 1.0000 | ORAL_TABLET | Freq: Two times a day (BID) | ORAL | 0 refills | Status: DC

## 2023-03-29 MED ORDER — DEXMEDETOMIDINE HCL IN NACL 80 MCG/20ML IV SOLN
INTRAVENOUS | Status: AC
  Filled 2023-03-29: qty 20

## 2023-03-29 MED ORDER — OXYCODONE HCL 5 MG PO TABS: 5.0000 mg | ORAL_TABLET | Freq: Once | ORAL | Status: DC | PRN

## 2023-03-29 MED ORDER — HYDROMORPHONE HCL 1 MG/ML IJ SOLN
INTRAMUSCULAR | Status: AC
  Filled 2023-03-29: qty 1

## 2023-03-29 MED ORDER — DIPHENHYDRAMINE HCL 12.5 MG/5ML PO ELIX: 12.5000 mg | ORAL_SOLUTION | Freq: Four times a day (QID) | ORAL | Status: DC | PRN

## 2023-03-29 MED ORDER — SODIUM CHLORIDE 0.9 % IV BOLUS
1000.0000 mL | Freq: Once | INTRAVENOUS | Status: AC
  Administered 2023-03-29: 1000 mL via INTRAVENOUS

## 2023-03-29 MED ORDER — BUPIVACAINE-EPINEPHRINE (PF) 0.25% -1:200000 IJ SOLN
INTRAMUSCULAR | Status: DC | PRN
Start: 2023-03-29 — End: 2023-03-29
  Administered 2023-03-29: 30 mL

## 2023-03-29 MED ORDER — PHENYLEPHRINE HCL-NACL 20-0.9 MG/250ML-% IV SOLN
INTRAVENOUS | Status: DC | PRN
Start: 2023-03-29 — End: 2023-03-29
  Administered 2023-03-29: 20 ug/min via INTRAVENOUS

## 2023-03-29 MED ORDER — CHLORHEXIDINE GLUCONATE 0.12 % MT SOLN
15.0000 mL | Freq: Once | OROMUCOSAL | Status: AC
  Administered 2023-03-29: 15 mL via OROMUCOSAL

## 2023-03-29 MED ORDER — ONDANSETRON HCL 4 MG/2ML IJ SOLN: 4.0000 mg | INTRAMUSCULAR | Status: DC | PRN

## 2023-03-29 MED ORDER — ONDANSETRON HCL 4 MG/2ML IJ SOLN
INTRAMUSCULAR | Status: AC
  Filled 2023-03-29: qty 2

## 2023-03-29 MED ORDER — HYDROMORPHONE HCL 1 MG/ML IJ SOLN: 0.5000 mg | INTRAMUSCULAR | Status: DC | PRN

## 2023-03-29 MED ORDER — MIDAZOLAM HCL 2 MG/2ML IJ SOLN
INTRAMUSCULAR | Status: AC
  Filled 2023-03-29: qty 2

## 2023-03-29 SURGICAL SUPPLY — 65 items
ADH SKN CLS APL DERMABOND .7 (GAUZE/BANDAGES/DRESSINGS) ×2
APL PRP STRL LF DISP 70% ISPRP (MISCELLANEOUS) ×2
APL SWBSTK 6 STRL LF DISP (MISCELLANEOUS) ×2
APPLICATOR COTTON TIP 6 STRL (MISCELLANEOUS) ×2 IMPLANT
APPLICATOR COTTON TIP 6IN STRL (MISCELLANEOUS) ×2
BAG COUNTER SPONGE SURGICOUNT (BAG) IMPLANT
BAG SPNG CNTER NS LX DISP (BAG)
CATH FOLEY 2WAY SLVR 18FR 30CC (CATHETERS) ×2 IMPLANT
CATH TIEMANN FOLEY 18FR 5CC (CATHETERS) ×2 IMPLANT
CHLORAPREP W/TINT 26 (MISCELLANEOUS) ×2 IMPLANT
CLIP LIGATING HEM O LOK PURPLE (MISCELLANEOUS) ×2 IMPLANT
COVER SURGICAL LIGHT HANDLE (MISCELLANEOUS) ×2 IMPLANT
COVER TIP SHEARS 8 DVNC (MISCELLANEOUS) ×2 IMPLANT
CUTTER ECHEON FLEX ENDO 45 340 (ENDOMECHANICALS) ×2 IMPLANT
DERMABOND ADVANCED .7 DNX12 (GAUZE/BANDAGES/DRESSINGS) ×2 IMPLANT
DRAPE ARM DVNC X/XI (DISPOSABLE) ×8 IMPLANT
DRAPE COLUMN DVNC XI (DISPOSABLE) ×2 IMPLANT
DRAPE SURG IRRIG POUCH 19X23 (DRAPES) ×2 IMPLANT
DRIVER NDL LRG 8 DVNC XI (INSTRUMENTS) ×4 IMPLANT
DRIVER NDLE LRG 8 DVNC XI (INSTRUMENTS) ×4
DRSG TEGADERM 4X4.75 (GAUZE/BANDAGES/DRESSINGS) ×2 IMPLANT
ELECT PENCIL ROCKER SW 15FT (MISCELLANEOUS) ×2 IMPLANT
ELECT REM PT RETURN 15FT ADLT (MISCELLANEOUS) ×2 IMPLANT
FORCEPS BPLR LNG DVNC XI (INSTRUMENTS) ×2 IMPLANT
FORCEPS PROGRASP DVNC XI (FORCEP) ×2 IMPLANT
GAUZE 4X4 16PLY ~~LOC~~+RFID DBL (SPONGE) IMPLANT
GAUZE SPONGE 2X2 8PLY STRL LF (GAUZE/BANDAGES/DRESSINGS) IMPLANT
GAUZE SPONGE 4X4 12PLY STRL (GAUZE/BANDAGES/DRESSINGS) ×2 IMPLANT
GLOVE BIO SURGEON STRL SZ 6.5 (GLOVE) ×2 IMPLANT
GLOVE SURG LX STRL 7.5 STRW (GLOVE) ×4 IMPLANT
GOWN STRL REUS W/ TWL XL LVL3 (GOWN DISPOSABLE) ×4 IMPLANT
GOWN STRL REUS W/TWL XL LVL3 (GOWN DISPOSABLE) ×4
GOWN STRL SURGICAL XL XLNG (GOWN DISPOSABLE) ×2 IMPLANT
HOLDER FOLEY CATH W/STRAP (MISCELLANEOUS) ×2 IMPLANT
IRRIG SUCT STRYKERFLOW 2 WTIP (MISCELLANEOUS) ×2
IRRIGATION SUCT STRKRFLW 2 WTP (MISCELLANEOUS) ×2 IMPLANT
IV LACTATED RINGERS 1000ML (IV SOLUTION) ×2 IMPLANT
KIT TURNOVER KIT A (KITS) IMPLANT
PACK ROBOT UROLOGY CUSTOM (CUSTOM PROCEDURE TRAY) ×2 IMPLANT
PAD POSITIONING PINK XL (MISCELLANEOUS) ×2 IMPLANT
PLUG CATH AND CAP STRL 200 (CATHETERS) ×2 IMPLANT
RELOAD STAPLE 45 4.1 GRN THCK (STAPLE) ×2 IMPLANT
SCISSORS MNPLR CVD DVNC XI (INSTRUMENTS) ×2 IMPLANT
SEAL UNIV 5-12 XI (MISCELLANEOUS) ×8 IMPLANT
SET CYSTO W/LG BORE CLAMP LF (SET/KITS/TRAYS/PACK) IMPLANT
SET TUBE SMOKE EVAC HIGH FLOW (TUBING) ×2 IMPLANT
SOL ELECTROSURG ANTI STICK (MISCELLANEOUS) ×2
SOL PREP POV-IOD 4OZ 10% (MISCELLANEOUS) ×2 IMPLANT
SOLUTION ELECTROSURG ANTI STCK (MISCELLANEOUS) ×2 IMPLANT
SPIKE FLUID TRANSFER (MISCELLANEOUS) ×2 IMPLANT
STAPLE RELOAD 45 GRN (STAPLE) ×2
SUT ETHILON 3 0 PS 1 (SUTURE) ×2 IMPLANT
SUT MNCRL AB 4-0 PS2 18 (SUTURE) ×4 IMPLANT
SUT V-LOC BARB 180 2/0GR6 GS22 (SUTURE) ×2
SUT VIC AB 0 CT1 27 (SUTURE) ×2
SUT VIC AB 0 CT1 27XBRD ANTBC (SUTURE) ×2 IMPLANT
SUT VIC AB 3-0 SH 27 (SUTURE) ×4
SUT VIC AB 3-0 SH 27X BRD (SUTURE) IMPLANT
SUT VIC AB 4-0 RB1 27 (SUTURE) ×4
SUT VIC AB 4-0 RB1 27XBRD (SUTURE) IMPLANT
SUT VICRYL 0 UR6 27IN ABS (SUTURE) ×4 IMPLANT
SUT VLOC BARB 180 ABS3/0GR12 (SUTURE) ×8
SUTURE V-LC BRB 180 2/0GR6GS22 (SUTURE) ×2 IMPLANT
SUTURE VLOC BRB 180 ABS3/0GR12 (SUTURE) ×4 IMPLANT
WATER STERILE IRR 1000ML POUR (IV SOLUTION) ×2 IMPLANT

## 2023-03-29 NOTE — Op Note (Signed)
Preoperative diagnosis:  Prostate Cancer   Postoperative diagnosis:  same   Procedure: Robotic assisted laparoscopic radical prostatectomy Bilateral pelvic lymph node dissection  Surgeon: Crist Fat, MD First Assistant: Harrie Foreman, PA  Anesthesia: General  Complications: None  Intraoperative findings:  #1 - large median lobe, large bladder neck requiring reconstruction on the right side.   #2 - margins grossly negative  #3 - bilateral nerve sparing  EBL: Minimal  Specimens:  #1.  Prostate and seminal vesicals #2.  Bilateral pelvic lymph nodes  Indication: Angel Chambers is a 67 y.o. patient with prostate cancer.  After reviewing the management options for treatment, he elected to proceed with the removal of his prostate. We have discussed the potential benefits and risks of the procedure, side effects of the proposed treatment, the likelihood of the patient achieving the goals of the procedure, and any potential problems that might occur during the procedure or recuperation. Informed consent has been obtained.  Description of procedure:  The patient was consented in the preoperative holding area. He was in brought back to the operating room placed the table in supine position. General anesthesia was then induced and endotracheal tube was inserted. He was then placed in dorsolithotomy position and placed in steep Trendelenburg. He was then prepped and draped in the routine sterile fashion. We, the first assistant and I, then began by making a 10 mm incision supraumbilical midline incision the skin down through into the peritoneum. Then placed a 8 mm trocar. I then inflated the abdomen and inserted the 0 robotic lens. We then placed 2 additional a 8 millimeter trochars in the patient's left lower abdomen proximally 9 cm apart and 2 trochars on the patient's right lower abdomen, one was an 8 mm trocar and the one most lateral was a 12 mm trocar which was used as the assistant  port. A 5 mm trocar was placed by triangulating the 2 right lateral ports as a second assistant port. These ports were all placed under visual guidance. Once the ports were noted to be satisfactory position the robot was docked. We started with the 0 lens, monopolar scissors in the right hand and the [place] forceps the left hand as well as a fenestrated grasper as the third arm on the left-hand side.   We, the first assistant and I,  began our dissection the posterior plane incising the peritoneum at the level of the vas deferens. Isolated the left vas deferens and dissected it proximally towards the spermatic cord for 5 cm prior to ligating it. Then used this as traction to isolate the left the seminal vesicle which was then undressed bluntly and completely dissected out, all vessels were cauterized with a combination of bipolar and the monopolar scissors. We then turned our attention to the right side and similarly dissected out the right vas deferens and seminal vesicle. Once the SVs had been freed, we turned our attention to the posterior plane and bluntly dissected the tissue between the rectum and the posterior wall of the prostate bluntly out towards the apex.    At this point the bladder was taken down starting at the urachal remnant with a combination of both blunt dissection and sharp dissection using monopolar cautery the bladder was dropped down in the usual fashion to the medial umbilical ligaments laterally and the dorsal vein of the prostate anteriorly creating our space of Retzius. We then turned our attention to the endopelvic fascia which was incised laterally starting on the patient's  right-hand side the levator muscles were pushed off the prostate laterally up towards the dorsal vein complex on the right-hand side. This process was then repeated on the left-hand side and a nice notch was created for the dorsal vein. I then used a 45mm stapler to staple the dorsal vein.   We,the first  assistant and I, then located the bladder neck at the vesicoprostatic junction and using the monopolar scissors dissected down through the perivesical tissues and the bladder neck down to the prostatic urethra. The catheter was then deflated and pulled through our urethral opening and then used to retract the prostate anteriorly for the posterior bladder neck dissection. Once through the bladder neck and into the posterior plane of the prostate, the SVs were brought through the opening. The left pedicle was then isolated and systematically ligated with Weck clips and scissors. The nerve bundle was then peeled off the posterior lateral aspect of the prostate and bluntly dissected away off the prostate.  This was then repeated on the right side.    I then came down through the dorsal venous complex anteriorly down to the membranous urethra using the monopolar. Once down to the urethra, it was transected sharply and the apex of the prostate was then dissected off the levator and rectourethralis muscles. Once the apex of the prostate had been dissected free we came back to the base of the prostate and bluntly push the rectum and nerve vascular bundle off the prostate the patient's left and used clips on the patient's right to free the prostate. Once the prostate was free it was placed off to the side. The pelvis was then irrigated with normal saline and noted to be relatively hemostatic.  Attention was then turned to the right pelvic sidewall. The fibrofatty tissue between the external iliac vein, confluence of the iliac vessels, hypogastric artery, and Cooper's ligament was dissected free from the pelvic sidewall with care to preserve the obturator nerve. Weck clips were used for lymphostasis and hemostasis. An identical procedure was performed on the contralateral side and the lymphatic packets were removed for permanent pathologic analysis.  The prostate and both lymph node tissues were placed in the Endo Catch  bag and the string brought to the 5 mm port.    The vesicourethral anastomosis was then completed with 2 interlocking 3-0 V. lock sutures running the anastomosis in the 6:00 position to the 12:00 position on each side and then tying it off on the top. The final catheter was then passed through the patient's urethra and into the bladder and 120 cc was instilled into the bladder to test the anastomosis. As there was no leak a 37 [place] Blake drain was passed through the left lateral port and placed around the vesicourethral anastomosis. A 12 mm assistant port on the right lateral side was then closed with 0 Vicryl with the help of the [employer] needle. The 12 mm midline infraumbilical incision was then extended another centimeter taken down and the fascia opened to remove the Endo Catch bag with the prostate specimen. The fascia was then closed with a 0 Vicryl and all skin ports were closed with 4-0 Monocryl in a subcutaneous fashion. Dermabond glue was then applied to the incisions. The drain was then secured to the skin with a 0 nylon stitch and dressing applied.   At the end of the case all laps needles and sponges had been accounted for. There no immediate complications. The patient returned to the PACU in stable  condition.

## 2023-03-29 NOTE — Anesthesia Procedure Notes (Signed)
Procedure Name: Intubation Date/Time: 03/29/2023 8:19 AM  Performed by: Sindy Guadeloupe, CRNAPre-anesthesia Checklist: Patient identified, Emergency Drugs available, Suction available, Patient being monitored and Timeout performed Patient Re-evaluated:Patient Re-evaluated prior to induction Oxygen Delivery Method: Circle system utilized Preoxygenation: Pre-oxygenation with 100% oxygen Induction Type: IV induction Ventilation: Mask ventilation without difficulty Laryngoscope Size: Mac and 4 Grade View: Grade II Tube type: Oral Tube size: 7.5 mm Number of attempts: 1 Airway Equipment and Method: Stylet Placement Confirmation: ETT inserted through vocal cords under direct vision, positive ETCO2 and breath sounds checked- equal and bilateral Secured at: 22 cm Tube secured with: Tape Dental Injury: Teeth and Oropharynx as per pre-operative assessment

## 2023-03-29 NOTE — Discharge Instructions (Signed)

## 2023-03-29 NOTE — Transfer of Care (Signed)
Immediate Anesthesia Transfer of Care Note  Patient: Angel Chambers  Procedure(s) Performed: XI ROBOTIC ASSISTED LAPAROSCOPIC RADICAL PROSTATECTOMY AND BILATERAL PELVIC LYMPH NODE DISSECTION (Bilateral)  Patient Location: PACU  Anesthesia Type:General  Level of Consciousness: awake, alert , and patient cooperative  Airway & Oxygen Therapy: Patient Spontanous Breathing and Patient connected to face mask oxygen  Post-op Assessment: Report given to RN and Post -op Vital signs reviewed and stable  Post vital signs: Reviewed and stable  Last Vitals:  Vitals Value Taken Time  BP 135/81 03/29/23 1253  Temp    Pulse 65 03/29/23 1256  Resp 19 03/29/23 1256  SpO2 100 % 03/29/23 1256  Vitals shown include unfiled device data.  Last Pain:  Vitals:   03/29/23 5188  TempSrc:   PainSc: 0-No pain         Complications: No notable events documented.

## 2023-03-29 NOTE — Anesthesia Postprocedure Evaluation (Signed)
Anesthesia Post Note  Patient: Angel Chambers  Procedure(s) Performed: XI ROBOTIC ASSISTED LAPAROSCOPIC RADICAL PROSTATECTOMY AND BILATERAL PELVIC LYMPH NODE DISSECTION (Bilateral)     Patient location during evaluation: PACU Anesthesia Type: General Level of consciousness: awake and alert and oriented Pain management: pain level controlled Vital Signs Assessment: post-procedure vital signs reviewed and stable Respiratory status: spontaneous breathing, nonlabored ventilation and respiratory function stable Cardiovascular status: blood pressure returned to baseline and stable Postop Assessment: no apparent nausea or vomiting Anesthetic complications: no   No notable events documented.  Last Vitals:  Vitals:   03/29/23 1400 03/29/23 1504  BP: 121/74 131/87  Pulse: 71 77  Resp: 11 17  Temp: (!) 36.4 C (!) 36.4 C  SpO2: 96% 100%    Last Pain:  Vitals:   03/29/23 1504  TempSrc: Oral  PainSc: 0-No pain                 Glendel Jaggers A.

## 2023-03-29 NOTE — Progress Notes (Signed)
RN noted small amount of blood on pt gown and a few drops on floor when getting pt OOB to bathroom. Pt assessed and medial R upper incision was leaking. RN cleaned pt and applied steristrips to incision. No more leaking noted. Pt has no complaints at this time.

## 2023-03-29 NOTE — Interval H&P Note (Signed)
History and Physical Interval Note:  03/29/2023 7:30 AM  Angel Chambers  has presented today for surgery, with the diagnosis of PROSTATE CANCER.  The various methods of treatment have been discussed with the patient and family. After consideration of risks, benefits and other options for treatment, the patient has consented to  Procedure(s) with comments: XI ROBOTIC ASSISTED LAPAROSCOPIC RADICAL PROSTATECTOMY (N/A) - 240 MINUTES AND BILATERAL PELVIC LYMPH NODE DISSECTION (Bilateral) as a surgical intervention.  The patient's history has been reviewed, patient examined, no change in status, stable for surgery.  I have reviewed the patient's chart and labs.  Questions were answered to the patient's satisfaction.     Crist Fat

## 2023-03-29 NOTE — H&P (Signed)
Prostate cancer   68 year old was healthy male presents today for further discussion of radical prostatectomy.   The patient was initially diagnosed in 2022 with favorable intermediate risk prostate cancer. At that time his PSA was 5.8. In July 2024 with a PSA of 6.5 the patient underwent a second prostate biopsy, MRI fusion guided, demonstrating Gleason 4+3 = 7 in the left apex (50%, pattern 4 70%).   The MRI demonstrated 2 PI-RADS 3 lesions. The prostate volume was 55 g. There was no evidence of extracapsular extension.   The patient was noted to have a large median lobe. He takes tamsulosin for his frequency, nocturia, and weak stream. His IPSS score is 16.   The patient is widowed, his wife died 2 years ago. He has not been sexually active for the last 7 years. He is not sure if he has adequate erectile function or not.   The patient is a [employer].   The patient takes thyroid medication. He otherwise has no past medical history.   The patient had a thyroidectomy in 2019, otherwise has never had surgery before.     ALLERGIES: None   MEDICATIONS: Levothyroxine Sodium 137 mcg tablet 1 tablet PO Daily  Tamsulosin Hcl 0.4 mg capsule 1 capsule PO Daily  Vitamin D3     GU PSH: No GU PSH    NON-GU PSH: No Non-GU PSH    GU PMH: No GU PMH    NON-GU PMH: No Non-GU PMH    FAMILY HISTORY: No Family History    SOCIAL HISTORY: No Social History    REVIEW OF SYSTEMS:    GU Review Male:   Patient reports trouble starting your stream, get up at night to urinate, and frequent urination. Patient denies penile pain, have to strain to urinate , burning/ pain with urination, erection problems, hard to postpone urination, leakage of urine, and stream starts and stops.  Gastrointestinal (Upper):   Patient denies nausea, vomiting, and indigestion/ heartburn.  Gastrointestinal (Lower):   Patient denies diarrhea and constipation.  Constitutional:   Patient denies fever, night sweats, weight  loss, and fatigue.  Skin:   Patient denies skin rash/ lesion and itching.  Eyes:   Patient denies blurred vision and double vision.  Ears/ Nose/ Throat:   Patient denies sore throat and sinus problems.  Hematologic/Lymphatic:   Patient denies swollen glands and easy bruising.  Cardiovascular:   Patient denies leg swelling and chest pains.  Respiratory:   Patient denies cough and shortness of breath.  Endocrine:   Patient denies excessive thirst.  Musculoskeletal:   Patient denies back pain and joint pain.  Neurological:   Patient denies headaches and dizziness.  Psychologic:   Patient denies depression and anxiety.   VITAL SIGNS:      03/09/2023 02:37 PM  Weight 200 lb / 90.72 kg  Height 72 in / 182.88 cm  BP 150/89 mmHg  Pulse 77 /min  Temperature 98.3 F / 36.8 C  BMI 27.1 kg/m   MULTI-SYSTEM PHYSICAL EXAMINATION:    Constitutional: Well-nourished. No physical deformities. Normally developed. Good grooming.  Neck: Neck symmetrical, not swollen. Normal tracheal position.  Respiratory: Normal breath sounds. No labored breathing, no use of accessory muscles.   Cardiovascular: Regular rate and rhythm. No murmur, no gallop. Normal temperature, normal extremity pulses, no swelling, no varicosities.   Lymphatic: No enlargement of neck, axillae, groin.  Skin: No paleness, no jaundice, no cyanosis. No lesion, no ulcer, no rash.  Neurologic / Psychiatric: Oriented  to time, oriented to place, oriented to person. No depression, no anxiety, no agitation.  Gastrointestinal: No mass, no tenderness, no rigidity, non obese abdomen.  Eyes: Normal conjunctivae. Normal eyelids.  Ears, Nose, Mouth, and Throat: Left ear no scars, no lesions, no masses. Right ear no scars, no lesions, no masses. Nose no scars, no lesions, no masses. Normal hearing. Normal lips.  Musculoskeletal: Normal gait and station of head and neck.     Complexity of Data:  Source Of History:  Patient  Lab Test Review:   PSA   Records Review:   Pathology Reports, Previous Doctor Records, Previous Patient Records, POC Tool  Urine Test Review:   Urinalysis   PROCEDURES:          Urinalysis Dipstick Dipstick Cont'd  Color: Yellow Bilirubin: Neg mg/dL  Appearance: Clear Ketones: Neg mg/dL  Specific Gravity: 9.563 Blood: Neg ery/uL  pH: 6.5 Protein: Neg mg/dL  Glucose: Neg mg/dL Urobilinogen: 0.2 mg/dL    Nitrites: Neg    Leukocyte Esterase: Neg leu/uL    ASSESSMENT:      ICD-10 Details  1 GU:   Prostate Cancer - C61    PLAN:           Schedule Return Visit/Planned Activity: ASAP - PT Referral          Document Letter(s):  Created for Patient: Clinical Summary    The patient was counseled about the natural history of prostate cancer and the standard treatment options that are available for prostate cancer. It was explained to him how his age and life expectancy, clinical stage, Gleason score, and PSA affect his prognosis, the decision to proceed with additional staging studies, as well as how that information influences recommended treatment strategies. We discussed the roles for active surveillance, radiation therapy, surgical therapy, androgen deprivation, as well as ablative therapy options for the treatment of prostate cancer as appropriate to his individual cancer situation. We discussed the risks and benefits of these options with regard to their impact on cancer control and also in terms of potential adverse events, complications, and impact on quality of life particularly related to urinary, bowel, and sexual function. The patient was encouraged to ask questions throughout the discussion today and all questions were answered to his stated satisfaction. In addition, the patient was provided with and/or directed to appropriate resources and literature for further education about prostate cancer and treatment options.   We discussed prostatectomy and specifically robotic prostatectomy with bilateral  pelvic lymphadenectomy. I showed the patient on their abdomen the approximately 6 small incision (trocar) sites as well as presumed extraction sites with robotic approach as well as possible open incision sites should open conversion be necessary. We discussed peri-operative risks including bleeding, infection, deep vein thrombosis, pulmonary embolism, compartment syndrome, neuropathy / neuropraxia, heart attack, stroke, death, as well as long-term risks such as non-cure / need for additional therapy. We specifically addressed that the procedure would compromise urinary control leading to stress incontinence which typically resolves with time and pelvic rehabilitation (Kegel's, etc..), but can sometimes be permanent and require additional therapy including surgery. We also specifically addressed sexual side-effects including significant erectile dysfunction which typically partially resolves with time but can also be permanent and require additional therapy including surgery.   We discussed the typical hospital course including usual 1-2 night hospitalization, discharge with foley catheter in place usually for 1-2 weeks before voiding trial as well as usually 2 week recovery until able to perform most non-strenuous activity and  6 weeks until able to return to most jobs and more strenuous activity such as exercise.         Notes:   The patient has significant voiding symptoms and erectile dysfunction, as a result, he is good candidate for prostate removal. We discussed the circumstance around it and the expected recovery. He has appropriate expectations. I we will go ahead and refer him for physical therapy. Will get him scheduled ASAP.

## 2023-03-29 NOTE — Anesthesia Preprocedure Evaluation (Signed)
Anesthesia Evaluation  Patient identified by MRN, date of birth, ID band Patient awake    Reviewed: Allergy & Precautions, NPO status , Patient's Chart, lab work & pertinent test results  Airway Mallampati: II  TM Distance: >3 FB Neck ROM: Full    Dental  (+) Teeth Intact, Caps   Pulmonary neg pulmonary ROS   Pulmonary exam normal breath sounds clear to auscultation       Cardiovascular negative cardio ROS Normal cardiovascular exam Rhythm:Regular Rate:Normal  EKG 03/22/23 NSR, minimal voltage criteria for LVH   Neuro/Psych negative neurological ROS  negative psych ROS   GI/Hepatic negative GI ROS, Neg liver ROS,,,  Endo/Other  Hypothyroidism  Hyperlipidemia Hx/o thyroid Ca S/P thyroidectomy  Renal/GU negative Renal ROS   Prostate Ca    Musculoskeletal negative musculoskeletal ROS (+)    Abdominal   Peds  Hematology negative hematology ROS (+)   Anesthesia Other Findings   Reproductive/Obstetrics                              Anesthesia Physical Anesthesia Plan  ASA: 2  Anesthesia Plan: General   Post-op Pain Management: Dilaudid IV, Ofirmev IV (intra-op)* and Precedex   Induction: Intravenous  PONV Risk Score and Plan: 4 or greater and Treatment may vary due to age or medical condition, Midazolam, Dexamethasone and Ondansetron  Airway Management Planned: Oral ETT  Additional Equipment: None  Intra-op Plan:   Post-operative Plan: Extubation in OR  Informed Consent: I have reviewed the patients History and Physical, chart, labs and discussed the procedure including the risks, benefits and alternatives for the proposed anesthesia with the patient or authorized representative who has indicated his/her understanding and acceptance.     Dental advisory given  Plan Discussed with:   Anesthesia Plan Comments:          Anesthesia Quick Evaluation

## 2023-03-30 ENCOUNTER — Encounter (HOSPITAL_COMMUNITY): Payer: Self-pay | Admitting: Urology

## 2023-03-30 DIAGNOSIS — R3915 Urgency of urination: Secondary | ICD-10-CM | POA: Diagnosis present

## 2023-03-30 DIAGNOSIS — R3916 Straining to void: Secondary | ICD-10-CM | POA: Diagnosis present

## 2023-03-30 DIAGNOSIS — N401 Enlarged prostate with lower urinary tract symptoms: Secondary | ICD-10-CM | POA: Diagnosis present

## 2023-03-30 DIAGNOSIS — C61 Malignant neoplasm of prostate: Secondary | ICD-10-CM | POA: Diagnosis present

## 2023-03-30 DIAGNOSIS — E890 Postprocedural hypothyroidism: Secondary | ICD-10-CM | POA: Diagnosis present

## 2023-03-30 DIAGNOSIS — Z79890 Hormone replacement therapy (postmenopausal): Secondary | ICD-10-CM | POA: Diagnosis not present

## 2023-03-30 LAB — HEMOGLOBIN AND HEMATOCRIT, BLOOD
HCT: 37.2 % — ABNORMAL LOW (ref 39.0–52.0)
Hemoglobin: 12.8 g/dL — ABNORMAL LOW (ref 13.0–17.0)

## 2023-03-30 LAB — BASIC METABOLIC PANEL
Anion gap: 8 (ref 5–15)
BUN: 11 mg/dL (ref 8–23)
CO2: 23 mmol/L (ref 22–32)
Calcium: 8.1 mg/dL — ABNORMAL LOW (ref 8.9–10.3)
Chloride: 106 mmol/L (ref 98–111)
Creatinine, Ser: 1.01 mg/dL (ref 0.61–1.24)
GFR, Estimated: >60 mL/min (ref >60)
Glucose, Bld: 139 mg/dL — ABNORMAL HIGH (ref 70–99)
Potassium: 3.7 mmol/L (ref 3.5–5.1)
Sodium: 137 mmol/L (ref 135–145)

## 2023-03-30 LAB — CREATININE, FLUID (PLEURAL, PERITONEAL, JP DRAINAGE): Creat, Fluid: 1.8 mg/dL

## 2023-03-30 MED ORDER — ACETAMINOPHEN 500 MG PO TABS
1000.0000 mg | ORAL_TABLET | Freq: Four times a day (QID) | ORAL | Status: DC
  Administered 2023-03-30 – 2023-03-31 (×3): 1000 mg via ORAL
  Filled 2023-03-30 (×3): qty 2

## 2023-03-30 MED ORDER — CHLORHEXIDINE GLUCONATE CLOTH 2 % EX PADS
6.0000 | MEDICATED_PAD | Freq: Every day | CUTANEOUS | Status: DC
  Administered 2023-03-30 – 2023-03-31 (×2): 6 via TOPICAL

## 2023-03-30 NOTE — Plan of Care (Signed)
  Problem: Education: Goal: Knowledge of the procedure and recovery process will improve Outcome: Progressing   Problem: Pain Management: Goal: General experience of comfort will improve Outcome: Progressing   Problem: Urinary Elimination: Goal: Ability to avoid or minimize complications of infection will improve Outcome: Progressing   Problem: Pain Managment: Goal: General experience of comfort will improve Outcome: Progressing   Problem: Safety: Goal: Ability to remain free from injury will improve Outcome: Progressing

## 2023-03-30 NOTE — Progress Notes (Signed)
1 Day Post-Op Subjective: The patient is doing well.  No nausea or vomiting. Pain is adequately controlled.  Objective: Vital signs in last 24 hours: Temp:  [97.5 F (36.4 C)-98.3 F (36.8 C)] 97.9 F (36.6 C) (10/11 1136) Pulse Rate:  [60-96] 60 (10/11 1136) Resp:  [11-20] 16 (10/11 1136) BP: (109-151)/(68-88) 131/80 (10/11 1136) SpO2:  [96 %-100 %] 100 % (10/11 1136)  Intake/Output from previous day: 10/10 0701 - 10/11 0700 In: 3182.2 [I.V.:2072.2; IV Piggyback:1110] Out: 1940 [Urine:1650; Drains:190; Blood:100] Intake/Output this shift: Total I/O In: 820 [P.O.:820] Out: 25 [Drains:25]  Physical Exam:  General: Alert and oriented. GI: Soft, Nondistended. Incisions: Dressings intact. Urine: Clear Extremities: Nontender, no erythema, no edema.  Lab Results: Recent Labs    03/29/23 1340 03/30/23 0402  HGB 14.5 12.8*  HCT 42.7 37.2*   Recent Labs    03/30/23 0402  NA 137  K 3.7  CL 106  CO2 23  GLUCOSE 139*  BUN 11  CREATININE 1.01  CALCIUM 8.1*      Assessment/Plan: POD# 1 s/p robotic prostatectomy.  JP drain output consistent with very small anastomosis leak.  1) SL IVF 2) Ambulate, Incentive spirometry 3) Transition to oral pain medication 4) Check JP drain creatinine tomorrow. 5) Keep patient one more night to ensure resolution of small urine leak.      LOS: 0 days   Crist Fat 03/30/2023, 11:50 AM

## 2023-03-30 NOTE — Plan of Care (Signed)
  Problem: Education: Goal: Knowledge of the procedure and recovery process will improve Outcome: Progressing   Problem: Bowel/Gastric: Goal: Gastrointestinal status for postoperative course will improve Outcome: Progressing   Problem: Pain Management: Goal: General experience of comfort will improve Outcome: Progressing   Problem: Skin Integrity: Goal: Demonstration of wound healing without infection will improve Outcome: Progressing   Problem: Urinary Elimination: Goal: Ability to avoid or minimize complications of infection will improve Outcome: Progressing

## 2023-03-31 LAB — CREATININE, FLUID (PLEURAL, PERITONEAL, JP DRAINAGE): Creat, Fluid: 1 mg/dL

## 2023-03-31 NOTE — Discharge Summary (Signed)
Date of admission: 03/29/2023  Date of discharge: 03/31/2023  Admission diagnosis: prostate cancer  Discharge diagnosis: same  Secondary diagnoses:  Patient Active Problem List   Diagnosis Date Noted   Prostate cancer (HCC) 03/29/2023   Benign prostatic hyperplasia without lower urinary tract symptoms 02/07/2022   Adult stuttering 11/03/2018   Other specified hypothyroidism 11/03/2018    Procedures performed: Procedure(s): XI ROBOTIC ASSISTED LAPAROSCOPIC RADICAL PROSTATECTOMY AND BILATERAL PELVIC LYMPH NODE DISSECTION  History and Physical: For full details, please see admission history and physical. Briefly, Angel Chambers is a 67 y.o. year old patient with Gleason 4+3 prostate cancer. He underwent RALP and BPLND on 03/29/23.   Hospital Course: Patient tolerated the procedure well.  He was then transferred to the floor after an uneventful PACU stay.  His hospital course was uncomplicated.  On POD#1 a JP creatinine was 1.8 compared to serum 1.1 JP drain was taken off suctoin and output remained minimal. Repeat JP creatinine on POD#2 was 1.0, negative for leak. The JP drain was removed. On POD#2 he had met discharge criteria: was eating a regular diet, was up and ambulating independently,  pain was well controlled, and was ready to for discharge. His JP drain was removed prior to discharge.   Laboratory values:  Recent Labs    03/29/23 1340 03/30/23 0402  HGB 14.5 12.8*  HCT 42.7 37.2*   Recent Labs    03/30/23 0402  NA 137  K 3.7  CL 106  CO2 23  GLUCOSE 139*  BUN 11  CREATININE 1.01  CALCIUM 8.1*   No results for input(s): "LABPT", "INR" in the last 72 hours. No results for input(s): "LABURIN" in the last 72 hours. Results for orders placed or performed during the hospital encounter of 03/22/23  Urine Culture     Status: None   Collection Time: 03/22/23  1:45 PM   Specimen: Urine, Clean Catch  Result Value Ref Range Status   Specimen Description   Final     URINE, CLEAN CATCH Performed at [facility], [address] [place], [place] [address]    Special Requests   Final    NONE Performed at [facility], [address], [place], [place] [address]    Culture   Final    NO GROWTH Performed at [facility], [address] [place], [place] [address]    Report Status 03/23/2023 FINAL  Final    Physical Exam  Gen: NAD Resp: Satting well on RA Card: Regular rate Abd: Soft, appropriately tender, ND, incision clean dry and intact GU:  Foley catheter in place draining urine. Neuro: Alert   Disposition: Home  Discharge instruction: The patient was instructed to be ambulatory but told to refrain from heavy lifting, strenuous activity, or driving.   Discharge medications:  Allergies as of 03/31/2023   No Known Allergies      Medication List     STOP taking these medications    tamsulosin 0.4 MG Caps capsule Commonly known as: FLOMAX   Vitamin D3 50 MCG (2000 UT) Tabs       TAKE these medications    docusate sodium 100 MG capsule Commonly known as: COLACE Take 1 capsule (100 mg total) by mouth 2 (two) times daily.   levothyroxine 137 MCG tablet Commonly known as: SYNTHROID Take 1 tablet (137 mcg total) by mouth daily before breakfast.   sulfamethoxazole-trimethoprim 800-160 MG tablet Commonly known as: BACTRIM DS Take 1 tablet by mouth  2 (two) times daily. Start the day prior to foley removal appointment   traMADol 50 MG tablet Commonly known as: Ultram Take 1-2 tablets (50-100 mg total) by mouth every 6 (six) hours as needed for moderate pain or severe pain.        Followup:   Follow-up Information     Hillery Aldo, NP Follow up on 04/05/2023.   Specialty: Nurse Practitioner Why: at 8:30 Contact information: [address] [place] [address] [place] [address] [phone]

## 2023-03-31 NOTE — Discharge Planning (Signed)
Transition of Care [facility]) - Inpatient Brief Assessment   Patient Details  Name: Angel Chambers MRN: [id number] Date of Birth: [birth year]  [facility]) CM/SW Contact:    Darleene Cleaver, LCSW Phone Number: 03/31/2023, 11:32 AM   Clinical Narrative:  CSW spoke to patient, he plans to return back home.  Patient lives with his daughter and his dog.  Patient does not express any problems affording his medications, and plans to return back home.  TOC signing off, please reconsult if other TOC needs arise.   Transition of Care Asessment: Insurance and Status: Insurance coverage has been reviewed Patient has primary care physician: Yes Home environment has been reviewed: Yes Prior level of function:: Indep Prior/Current Home Services: No current home services Social Determinants of Health Reivew: SDOH reviewed no interventions necessary Readmission risk has been reviewed: Yes Transition of care needs: no transition of care needs at this time

## 2023-04-02 LAB — SURGICAL PATHOLOGY

## 2023-04-04 DIAGNOSIS — I82612 Acute embolism and thrombosis of superficial veins of left upper extremity: Secondary | ICD-10-CM | POA: Diagnosis not present

## 2023-04-04 DIAGNOSIS — I82613 Acute embolism and thrombosis of superficial veins of upper extremity, bilateral: Secondary | ICD-10-CM | POA: Diagnosis not present

## 2023-04-04 DIAGNOSIS — Z79890 Hormone replacement therapy (postmenopausal): Secondary | ICD-10-CM | POA: Diagnosis not present

## 2023-04-04 DIAGNOSIS — I808 Phlebitis and thrombophlebitis of other sites: Secondary | ICD-10-CM | POA: Diagnosis not present

## 2023-04-04 DIAGNOSIS — M79602 Pain in left arm: Secondary | ICD-10-CM | POA: Diagnosis not present

## 2023-04-06 DIAGNOSIS — C61 Malignant neoplasm of prostate: Secondary | ICD-10-CM | POA: Diagnosis not present

## 2023-04-09 ENCOUNTER — Ambulatory Visit: Payer: Medicare Other | Admitting: Urology

## 2023-04-12 ENCOUNTER — Telehealth: Payer: Self-pay

## 2023-04-12 NOTE — Telephone Encounter (Signed)
Opened in error

## 2023-04-12 NOTE — Telephone Encounter (Signed)
Paperwork given to Dr. Mena Goes on 04/09/2023 to complete attending statement and sign.  MD Reviewed paperwork but did not sign. Will collect signature upon MD arrival back to clinic in [facility] November 4th.

## 2023-04-19 ENCOUNTER — Ambulatory Visit (INDEPENDENT_AMBULATORY_CARE_PROVIDER_SITE_OTHER): Payer: Medicare Other | Admitting: Family Medicine

## 2023-04-19 ENCOUNTER — Encounter: Payer: Self-pay | Admitting: Family Medicine

## 2023-04-19 VITALS — BP 131/85 | HR 80 | Temp 97.9°F | Ht 73.0 in | Wt 192.4 lb

## 2023-04-19 DIAGNOSIS — K080 Exfoliation of teeth due to systemic causes: Secondary | ICD-10-CM | POA: Diagnosis not present

## 2023-04-19 DIAGNOSIS — R252 Cramp and spasm: Secondary | ICD-10-CM | POA: Diagnosis not present

## 2023-04-19 DIAGNOSIS — R1900 Intra-abdominal and pelvic swelling, mass and lump, unspecified site: Secondary | ICD-10-CM

## 2023-04-19 DIAGNOSIS — M79605 Pain in left leg: Secondary | ICD-10-CM | POA: Diagnosis not present

## 2023-04-19 NOTE — Progress Notes (Signed)
Subjective:  Patient ID: Angel Chambers, male    DOB: [birth year], 67 y.o.   MRN: [id number]  Patient Care Team: Mechele Claude, MD as PCP - General (Family Medicine) Jena Gauss Gerrit Friends, MD as Consulting Physician (Gastroenterology)   Chief Complaint:  Flank Pain (Right flank pain since prostate surgery x 3 weeks ago ) and leg cramps (Left leg x 1 week )   HPI: Angel Chambers is a 67 y.o. male presenting on 04/19/2023 for Flank Pain (Right flank pain since prostate surgery x 3 weeks ago ) and leg cramps (Left leg x 1 week )   Discussed the use of AI scribe software for clinical note transcription with the patient, who gave verbal consent to proceed.  History of Present Illness   Angel Chambers presents with complaints of leg pain, described as a sharp, cramping sensation localized to the front of the leg, specifically the thigh, knee and calf area. The pain does not appear to radiate from the hip or elsewhere. The patient denies any recent injury and has attempted self-management with a heat pad and increased hydration, with no relief.  In addition to the leg pain, Angel Chambers reports post-surgical discomfort and swelling following a prostate surgery performed three weeks prior. The patient describes the surgical site as swollen and aching, with the pain being particularly intense in the lower abdominal region.  Furthermore, Angel Chambers has been experiencing issues with his bowel movements, with the last reported bowel movement occurring two days prior to the consultation. The patient has been taking a stool softener to manage this issue.          Relevant past medical, surgical, family, and social history reviewed and updated as indicated.  Allergies and medications reviewed and updated. Data reviewed: Chart in Epic.   Past Medical History:  Diagnosis Date   History of kidney stones    S/P total thyroidectomy 01/14/2018   Thyroid cancer [facility])     Past Surgical History:  Procedure  Laterality Date   COLONOSCOPY N/A 12/24/2018   Procedure: COLONOSCOPY;  Surgeon: Corbin Ade, MD;  [facility];  Service: Endoscopy;  Laterality: N/A;  12:45pm   FLEXIBLE SIGMOIDOSCOPY N/A 06/18/2018   Procedure: FLEXIBLE SIGMOIDOSCOPY;  Surgeon: Corbin Ade, MD;  [facility];  Service: Endoscopy;  Laterality: N/A;   PELVIC LYMPH NODE DISSECTION Bilateral 03/29/2023   Procedure: AND BILATERAL PELVIC LYMPH NODE DISSECTION;  Surgeon: Crist Fat, MD;  Location: [facility];  Service: Urology;  Laterality: Bilateral;   ROBOT ASSISTED LAPAROSCOPIC RADICAL PROSTATECTOMY N/A 03/29/2023   Procedure: XI ROBOTIC ASSISTED LAPAROSCOPIC RADICAL PROSTATECTOMY;  Surgeon: Crist Fat, MD;  Location: [facility];  Service: Urology;  Laterality: N/A;  240 MINUTES   THYROIDECTOMY N/A 01/14/2018   Procedure: TOTAL THYROIDECTOMY;  Surgeon: Franky Macho, MD;  Location: [facility];  Service: General;  Laterality: N/A;   WRIST FRACTURE SURGERY Left 2004    Social History   Socioeconomic History   Marital status: Widowed    Spouse name: Not on file   Number of children: Not on file   Years of education: Not on file   Highest education level: Not on file  Occupational History   Not on file  Tobacco Use   Smoking status: Never   Smokeless tobacco: Never  Vaping Use   Vaping status: Never Used  Substance and Sexual Activity   Alcohol use: No   Drug use: No   Sexual  activity: Yes  Other Topics Concern   Not on file  Social History Narrative   Not on file   Social Determinants of Health   Financial Resource Strain: Low Risk  (11/29/2022)   Overall Financial Resource Strain (CARDIA)    Difficulty of Paying Living Expenses: Not hard at all  Food Insecurity: No Food Insecurity (03/29/2023)   Hunger Vital Sign    Worried About Running Out of Food in the Last Year: Never true    Ran Out of Food in the Last Year: Never true  Transportation Needs: No Transportation Needs  (03/29/2023)   PRAPARE - Administrator, Civil Service (Medical): No    Lack of Transportation (Non-Medical): No  Physical Activity: Insufficiently Active (11/29/2022)   Exercise Vital Sign    Days of Exercise per Week: 3 days    Minutes of Exercise per Session: 30 min  Stress: No Stress Concern Present (11/29/2022)   [employer] of Occupational Health - Occupational Stress Questionnaire    Feeling of Stress : Not at all  Social Connections: Moderately Isolated (11/29/2022)   Social Connection and Isolation Panel [NHANES]    Frequency of Communication with Friends and Family: More than three times a week    Frequency of Social Gatherings with Friends and Family: More than three times a week    Attends Religious Services: More than 4 times per year    Active Member of [employer] or Organizations: No    Attends Banker Meetings: Never    Marital Status: Widowed  Intimate Partner Violence: Not At Risk (03/29/2023)   Humiliation, Afraid, Rape, and Kick questionnaire    Fear of Current or Ex-Partner: No    Emotionally Abused: No    Physically Abused: No    Sexually Abused: No    Outpatient Encounter Medications as of 04/19/2023  Medication Sig   levothyroxine (SYNTHROID) 137 MCG tablet Take 1 tablet (137 mcg total) by mouth daily before breakfast.   docusate sodium (COLACE) 100 MG capsule Take 1 capsule (100 mg total) by mouth 2 (two) times daily. (Patient not taking: Reported on 04/19/2023)   traMADol (ULTRAM) 50 MG tablet Take 1-2 tablets (50-100 mg total) by mouth every 6 (six) hours as needed for moderate pain or severe pain. (Patient not taking: Reported on 04/19/2023)   [DISCONTINUED] sulfamethoxazole-trimethoprim (BACTRIM DS) 800-160 MG tablet Take 1 tablet by mouth 2 (two) times daily. Start the day prior to foley removal appointment   No facility-administered encounter medications on file as of 04/19/2023.    No Known Allergies  Pertinent ROS per HPI,  otherwise unremarkable      Objective:  BP 131/85   Pulse 80   Temp 97.9 F (36.6 C) (Temporal)   Ht 6\' 1"  (1.854 m)   Wt 192 lb 6.4 oz (87.3 kg)   SpO2 95%   BMI 25.38 kg/m    Wt Readings from Last 3 Encounters:  04/19/23 192 lb 6.4 oz (87.3 kg)  03/29/23 194 lb (88 kg)  03/22/23 194 lb (88 kg)    Physical Exam Vitals and nursing note reviewed.  Constitutional:      Appearance: Normal appearance.  HENT:     Head: Normocephalic and atraumatic.     Nose: Nose normal.     Mouth/Throat:     Mouth: Mucous membranes are moist.     Pharynx: Oropharynx is clear.  Eyes:     Conjunctiva/sclera: Conjunctivae normal.     Pupils: Pupils are equal,  round, and reactive to light.  Cardiovascular:     Rate and Rhythm: Regular rhythm.     Heart sounds: Normal heart sounds.  Pulmonary:     Effort: Pulmonary effort is normal.     Breath sounds: Normal breath sounds.  Abdominal:     General: There is distension.    Neurological:     Mental Status: He is alert.     Comments: Stuttering   Psychiatric:        Mood and Affect: Mood normal.        Behavior: Behavior normal.        Thought Content: Thought content normal.        Judgment: Judgment normal.    Physical Exam   SKIN: Surgical incision site on the side exhibits swelling and tightness, no signs of infection observed.        Results for orders placed or performed during the hospital encounter of 03/29/23  Hemoglobin and hematocrit, blood  Result Value Ref Range   Hemoglobin 14.5 13.0 - 17.0 g/dL   HCT 33.2 95.1 - 88.4 %  Basic metabolic panel  Result Value Ref Range   Sodium 137 135 - 145 mmol/L   Potassium 3.7 3.5 - 5.1 mmol/L   Chloride 106 98 - 111 mmol/L   CO2 23 22 - 32 mmol/L   Glucose, Bld 139 (H) 70 - 99 mg/dL   BUN 11 8 - 23 mg/dL   Creatinine, Ser 1.66 0.61 - 1.24 mg/dL   Calcium 8.1 (L) 8.9 - 10.3 mg/dL   GFR, Estimated >06 >30 mL/min   Anion gap 8 5 - 15  Hemoglobin and hematocrit, blood   Result Value Ref Range   Hemoglobin 12.8 (L) 13.0 - 17.0 g/dL   HCT 16.0 (L) 10.9 - 32.3 %  Creatinine, fluid (pleural, peritoneal, JP Drainage)  Result Value Ref Range   Creat, Fluid 1.8 mg/dL   Fluid Type-FCRE JP DRAINAGE   Creatinine, fluid (pleural, peritoneal, JP Drainage)  Result Value Ref Range   Creat, Fluid 1.0 mg/dL   Fluid Type-FCRE JP DRAINAGE   Surgical pathology  Result Value Ref Range   SURGICAL PATHOLOGY      SURGICAL PATHOLOGY CASE: [id number] PATIENT: Canary Brim Surgical Pathology Report     Clinical History: Prostate cancer (crm)     FINAL MICROSCOPIC DIAGNOSIS:  A. PROSTATE, RADICAL PROSTATECTOMY: Prostatic adenocarcinoma, Gleason score 3+4=7 (grade group 2) Tumor is organ confined (pT2) Seminal vesicles, vasa deferentia and all margins of resection are negative for tumor  B. LYMPH NODE, RIGHT PELVIC, EXCISION: One benign lymph node (0/1)  C. LYMPH NODES, LEFT PELVIC, EXCISION: Four benign lymph nodes (0/4)   PROSTATE GLAND:  Procedure: Radical Prostatectomy Prostate Size: --4.5 x 4.5 x 4.2 cm, 65 grams Histologic Type: Acinar Histologic Grade, Grade Group and Gleason Score: 3+4=7 (grade group 2) Minor Tertiary Pattern 5 (Less than 5%): Negative Percentage of Pattern 4 in Gleason Score 7 (3+4, 4+3) Cancer (report if applicable): Pattern 4=20% Intraductal carcinoma (incorporated into Grade): Negative Cribriform gland (applicable to Gleason score 7  or 8 cancer only): Negative  Tumor Quantitation (estimated percentage of prostate involved by tumor): 15% Extraprostatic Extension: Negative Urinary Bladder Neck Invasion: Negative Seminal Vesicle Invasion: Negative Lymphovascular invasion: Negative Treatment Effect: Negative Margins: Negative      Margin(s) Involved by Invasive Carcinoma (Gleason pattern at margins): NA      Margin Involvement by Invasive Carcinoma in Area of Extraprostatic Extension (EPE): NA  Regional Lymph  Nodes:         No lymph nodes submitted or found: NA         Number of Lymph Nodes Involved: 0         Number of Lymph Nodes Examined: 5 Distant metastasis: NA Pathologic Stage Classification (pTNM, AJCC 8th Edition):  pT2, pN0 Representative Tumor Block: A5 Comment(s): Index tumor: Gleason score 3+4=7 extensively involves left anterior from apex to base of the gland. Secondary tumors: Gleason score 3+3=6 minimally and multifocally involves right anterior and bilateral posterior at ape x and mid-portion of the gland.  Additional findings: Benign prostatic glandular hyperplasia  (v4.2.0.1)  GROSS DESCRIPTION:  Specimen A: Radical prostatectomy including bilateral seminal vesicles, received fresh. Weight: 65 g Prostate size: 4.5 cm from right to left, 4.5 cm from apex to base, up to 4.2 cm from anterior to posterior. Inked margins: Left side yellow, right side black, staple line green Cut surface of prostate: Pink-white, diffusely nodular.  There is a 1.6 cm nodular portion of prostate bulging near the bladder neck margin.  No discrete mass lesions. Cut surface of seminal vesicles: Unremarkable Block Summary: Blocks 1, 2 = apex Blocks 3, 4 = bladder neck margin Blocks 5-13 = full-thickness sections of prostate, sequentially submitted from apex to base Block 14 = seminal vesicles  Specimen B: Received fresh is a 3.3 cm yellow-pink soft to rubbery nodule which is sectioned and entirely submitted in 1 block.  Specimen C: Received fresh is a 3. 8 x 3.2 x 1.8 cm aggregate of fat within which are found 5 soft to rubbery yellow-pink nodules which range from 1 to 1.3 cm and are entirely submitted as follows: Block 1 = 2 nodules, whole Block 2 = 2 nodules bisected (1 inked black) Block 3 = 1 nodule, bisected  SW 03/30/2023    Final Diagnosis performed by Marlena Clipper, MD.   Electronically signed 04/02/2023 Technical and / or Professional components performed at [facility] [address], [place], [place] [address].  Immunohistochemistry Technical component (if applicable) was performed at [facility]. [address], [address], [place], [place] [address].   IMMUNOHISTOCHEMISTRY DISCLAIMER (if applicable): Some of these immunohistochemical stains may have been developed and the performance characteristics determine by [facility]. Some may not have been cleared or approved by the U.S. Food and Drug Administration. The FDA has determined that such clearance or a pproval is not necessary. This test is used for clinical purposes. It should not be regarded as investigational or for research. This laboratory is certified under the Clinical Laboratory Improvement Amendments of 1988 (CLIA-88) as qualified to perform high complexity clinical laboratory testing.  The controls stained appropriately.   IHC stains are performed on formalin fixed, paraffin embedded tissue using a 3,3"diaminobenzidine (DAB) chromogen and Leica Bond Autostainer System. The staining intensity of the nucleus is score manually and is reported as the percentage of tumor cell nuclei demonstrating specific nuclear staining. The specimens are fixed in 10% Neutral Formalin for at least 6 hours and up to 72hrs. These tests are validated on decalcified tissue. Results should be interpreted with caution given the possibility of false negative results on decalcified specimens. Antibody Clones are as follows ER-clone 28F, PR-clone 16, Ki67- clone MM1. Some of these immunoh istochemical stains may have been developed and the performance characteristics determined by [facility].        Pertinent labs & imaging results that were available during my care of the patient  were reviewed by me and considered in my medical decision making.  Assessment & Plan:  Michele was seen today for flank pain and leg cramps.  Diagnoses and all orders for  this visit:  Pain of left anterior lower extremity -     BMP8+EGFR -     CBC with Differential/Platelet -     Magnesium  Leg cramping -     BMP8+EGFR -     CBC with Differential/Platelet -     Magnesium  Abdominal swelling -     US Abdomen Complete; Future     Assessment and Plan    Leg Pain Sharp pain and cramping in the anterior aspect of the leg, possibly due to dehydration or sciatica. No clear radicular pattern. -Order labs to check potassium and magnesium levels. -Advise patient to use Tylenol and moist heat for symptomatic relief.  Post-Prostate Surgery Swelling Patient reports persistent swelling and discomfort three weeks post-prostate surgery. No signs of infection on examination. -Order abdominal ultrasound to further evaluate the swelling. -Advise patient to contact the surgeon regarding these post-surgical issues.  Constipation Last bowel movement reported on Tuesday, patient is currently taking stool softeners. -Continue current regimen.  Follow-up -Advise patient to follow up if symptoms worsen or change. -Plan to review results of labs and ultrasound. -Encourage patient to maintain scheduled appointments with surgeon and therapist.          Continue all other maintenance medications.  Follow up plan: Return if symptoms worsen or fail to improve.   Continue healthy lifestyle choices, including diet (rich in fruits, vegetables, and lean proteins, and low in salt and simple carbohydrates) and exercise (at least 30 minutes of moderate physical activity daily).  Educational handout given for leg cramps  The above assessment and management plan was discussed with the patient. The patient verbalized understanding of and has agreed to the management plan. Patient is aware to call the clinic if they develop any new symptoms or if symptoms persist or worsen. Patient is aware when to return to the clinic for a follow-up visit. Patient educated on when it is  appropriate to go to the emergency department.   Kari Baars, FNP-C [facility] [phone]

## 2023-04-20 LAB — BMP8+EGFR
BUN/Creatinine Ratio: 17 (ref 10–24)
BUN: 14 mg/dL (ref 8–27)
CO2: 23 mmol/L (ref 20–29)
Calcium: 9 mg/dL (ref 8.6–10.2)
Chloride: 105 mmol/L (ref 96–106)
Creatinine, Ser: 0.82 mg/dL (ref 0.76–1.27)
Glucose: 116 mg/dL — ABNORMAL HIGH (ref 70–99)
Potassium: 4 mmol/L (ref 3.5–5.2)
Sodium: 141 mmol/L (ref 134–144)
eGFR: 97 mL/min/{1.73_m2} (ref >59)

## 2023-04-20 LAB — CBC WITH DIFFERENTIAL/PLATELET
Basophils Absolute: 0 10*3/uL (ref 0.0–0.2)
Basos: 1 %
EOS (ABSOLUTE): 0.1 10*3/uL (ref 0.0–0.4)
Eos: 2 %
Hematocrit: 44.2 % (ref 37.5–51.0)
Hemoglobin: 14.9 g/dL (ref 13.0–17.7)
Immature Grans (Abs): 0 10*3/uL (ref 0.0–0.1)
Immature Granulocytes: 0 %
Lymphocytes Absolute: 1.4 10*3/uL (ref 0.7–3.1)
Lymphs: 26 %
MCH: 32.6 pg (ref 26.6–33.0)
MCHC: 33.7 g/dL (ref 31.5–35.7)
MCV: 97 fL (ref 79–97)
Monocytes Absolute: 0.5 10*3/uL (ref 0.1–0.9)
Monocytes: 9 %
Neutrophils Absolute: 3.4 10*3/uL (ref 1.4–7.0)
Neutrophils: 62 %
Platelets: 265 10*3/uL (ref 150–450)
RBC: 4.57 x10E6/uL (ref 4.14–5.80)
RDW: 12.8 % (ref 11.6–15.4)
WBC: 5.4 10*3/uL (ref 3.4–10.8)

## 2023-04-20 LAB — MAGNESIUM: Magnesium: 2.3 mg/dL (ref 1.6–2.3)

## 2023-04-23 NOTE — Telephone Encounter (Signed)
Forms completed and mailed back to patient with envelope provided by patient.

## 2023-04-26 DIAGNOSIS — N393 Stress incontinence (female) (male): Secondary | ICD-10-CM | POA: Diagnosis not present

## 2023-04-26 DIAGNOSIS — M6281 Muscle weakness (generalized): Secondary | ICD-10-CM | POA: Diagnosis not present

## 2023-04-26 DIAGNOSIS — M62838 Other muscle spasm: Secondary | ICD-10-CM | POA: Diagnosis not present

## 2023-05-02 ENCOUNTER — Ambulatory Visit (HOSPITAL_COMMUNITY): Payer: Medicare Other

## 2023-05-07 ENCOUNTER — Ambulatory Visit: Payer: Medicare Other | Admitting: Urology

## 2023-05-07 VITALS — BP 158/97 | HR 74

## 2023-05-07 DIAGNOSIS — C61 Malignant neoplasm of prostate: Secondary | ICD-10-CM

## 2023-05-07 LAB — URINALYSIS, ROUTINE W REFLEX MICROSCOPIC
Bilirubin, UA: NEGATIVE
Glucose, UA: NEGATIVE
Ketones, UA: NEGATIVE
Nitrite, UA: NEGATIVE
Protein,UA: NEGATIVE
RBC, UA: NEGATIVE
Specific Gravity, UA: 1.02 (ref 1.005–1.030)
Urobilinogen, Ur: 1 mg/dL (ref 0.2–1.0)
pH, UA: 7.5 (ref 5.0–7.5)

## 2023-05-07 LAB — MICROSCOPIC EXAMINATION

## 2023-05-07 NOTE — Progress Notes (Unsigned)
05/07/2023 12:07 PM   Angel Chambers 1956-05-29 [id number]  Referring provider: Mechele Claude, MD [address] [place],  [place] [address]  No chief complaint on file.   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 His PSA was 05/20 PSA 3.8 and then 05/22 PSA 5.8. I thought he had a right prostate nodule on exam but at the biopsy in lateral decubitus, I got a better exam and prostate felt benign. No FH of PCa. He underwent prostate bx 01/03/2021. His PSA remain lower/stable at 5.24 Apr 2021. His Aug 2023 PSA down to 5.2. Aug 2023 exam with R>L asymmetry of prostate but no nodules, 50 g.   His PSA rose to 6.22 August 2022 so we got a prostate MRI.  Prostate MRI March 2024 showed negative staging but showed a PI-RADS 3 lesion left anterior apical and the PI-RADS 3 lesion right peripheral zone.  He underwent a cognitive fusion biopsy on December 18, 2022 which confirmed unfavorable intermediate risk disease at the left apex anterior.   Biopsy: #17 December 2020 Early Intermediate risk PCa PSA 5.8 T1c (R>L asymmetry) Prostate 66 g (psad 0.09) GG 2, one core, 10% (pattern 4 10%) - right apex   1/12   #19 December 2022 Unfavorable intermediate risk disease PSA 6.5 T1c Prostate 45 to 74 g depending on median lobe calculation GG 3, 1 core, left apical, 50% (pattern 4 -- 70%) GG 2, 1 core, left apex, 5% GG 1, 1 core, left base, 5% MSK-lymph node risk 8%   Staging: Mar 2024 pMRI which showed ROI#1 18 mm LEFT PIRADS 3 anterior apical FMS with capsular bulging, ROI#2 16 mm PIRADS 3 RIGHT PZ. Prostate 55 g. Staging negative apart from poss ECE with capsular bulge.    2) BPH - He is taking tamsulosin. No prostate surgery. Prostate 66 g on [place]. AUASS = 16. Occ . PVR 28 ml. Frequency, nocturia, weak stream. UA clear. He mentioned the LUTS again and noted they are more bothersome.    Today, seen for the above.  He underwent October 2024 robotic assisted lap radical  prostatectomy with bilateral pelvic lymph node dissection with Dr. Marlou Porch.  He required reconstruction at the right bladder neck. Final path was T2, grade group 2, negative margins and negative lymph nodes.  Prostate was 65 g.   He reports it took Dr. Linzie Collin "6 hours" and he is still under his care and doing PT. Inst to only lift 8 -10 lbs. He is wearing pads. They are dry but will get wet. Using 1 ppd. Good stream.    His wife died from a hip fx in May 27, 2021. She was already on HD. She got pneumonia. His wife had kTx and needed another one.    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  PMH: Past Medical History:  Diagnosis Date   History of kidney stones    S/P total thyroidectomy 01/14/2018   Thyroid cancer [facility])     Surgical History: Past Surgical History:  Procedure Laterality Date   COLONOSCOPY N/A 12/24/2018   Procedure: COLONOSCOPY;  Surgeon: Corbin Ade, MD;  [facility];  Service: Endoscopy;  Laterality: N/A;  12:45pm   FLEXIBLE SIGMOIDOSCOPY N/A 06/18/2018   Procedure: FLEXIBLE SIGMOIDOSCOPY;  Surgeon: Corbin Ade, MD;  [facility];  Service: Endoscopy;  Laterality: N/A;   PELVIC LYMPH NODE DISSECTION Bilateral 03/29/2023  Procedure: AND BILATERAL PELVIC LYMPH NODE DISSECTION;  Surgeon: Crist Fat, MD;  Location: [facility];  Service: Urology;  Laterality: Bilateral;   ROBOT ASSISTED LAPAROSCOPIC RADICAL PROSTATECTOMY N/A 03/29/2023   Procedure: XI ROBOTIC ASSISTED LAPAROSCOPIC RADICAL PROSTATECTOMY;  Surgeon: Crist Fat, MD;  Location: [facility];  Service: Urology;  Laterality: N/A;  240 MINUTES   THYROIDECTOMY N/A 01/14/2018   Procedure: TOTAL THYROIDECTOMY;  Surgeon: Franky Macho, MD;  Location: [facility];  Service: General;  Laterality: N/A;   WRIST FRACTURE SURGERY Left 2004    Home Medications:  Allergies as of 05/07/2023   No Known Allergies      Medication List        Accurate as of May 07, 2023  12:07 PM. If you have any questions, ask your nurse or doctor.          docusate sodium 100 MG capsule Commonly known as: COLACE Take 1 capsule (100 mg total) by mouth 2 (two) times daily.   levothyroxine 137 MCG tablet Commonly known as: SYNTHROID Take 1 tablet (137 mcg total) by mouth daily before breakfast.   traMADol 50 MG tablet Commonly known as: Ultram Take 1-2 tablets (50-100 mg total) by mouth every 6 (six) hours as needed for moderate pain or severe pain.        Allergies: No Known Allergies  Family History: Family History  Problem Relation Age of Onset   Diabetes Mother    Kidney disease Mother    Heart attack Father    Cancer Sister        hysterectomy due to cancer   Hypertension Brother    Heart disease Brother    Thyroid disease Brother    Colon cancer Neg Hx     Social History:  reports that he has never smoked. He has never used smokeless tobacco. He reports that he does not drink alcohol and does not use drugs.   Physical Exam: BP (!) 158/97   Pulse 74   Constitutional:  Alert and oriented, No acute distress. HEENT: [place] AT, moist mucus membranes.  Trachea midline, no masses. Cardiovascular: No clubbing, cyanosis, or edema. Respiratory: Normal respiratory effort, no increased work of breathing. GI: Abdomen is soft, nontender, nondistended, no abdominal masses, incision C/d/I without E/E/I GU: No CVA tenderness Skin: No rashes, bruises or suspicious lesions. Neurologic: Grossly intact, no focal deficits, moving all 4 extremities. Psychiatric: Normal mood and affect.  Laboratory Data: Lab Results  Component Value Date   WBC 5.4 04/19/2023   HGB 14.9 04/19/2023   HCT 44.2 04/19/2023   MCV 97 04/19/2023   PLT 265 04/19/2023    Lab Results  Component Value Date   CREATININE 0.82 04/19/2023    No results found for: "PSA"  No results found for: "TESTOSTERONE"  No results found for: "HGBA1C"  Urinalysis    Component Value Date/Time    APPEARANCEUR Clear 02/08/2023 1651   GLUCOSEU Negative 02/08/2023 1651   BILIRUBINUR Negative 02/08/2023 1651   PROTEINUR Negative 02/08/2023 1651   UROBILINOGEN negative 11/11/2013 1641   NITRITE Negative 02/08/2023 1651   LEUKOCYTESUR Negative 02/08/2023 1651    Lab Results  Component Value Date   LABMICR Comment 12/04/2022   WBCUA None seen 05/23/2021   LABEPIT None seen 05/23/2021   BACTERIA None seen 05/23/2021    Pertinent Imaging: N/a  Op notes, Path reviewed   Assessment & Plan:    1. Prostate cancer [facility]) He is following up with Dr. Marlou Porch and PT at  [facility]. I'll plan to see him back here in about 3 months.   - Urinalysis, Routine w reflex microscopic   No follow-ups on file.  Jerilee Field, MD  [facility]  [address] [place], [place] [address] [phone]

## 2023-05-10 ENCOUNTER — Other Ambulatory Visit: Payer: Self-pay | Admitting: Family Medicine

## 2023-05-15 ENCOUNTER — Telehealth: Payer: Self-pay

## 2023-05-15 DIAGNOSIS — M6281 Muscle weakness (generalized): Secondary | ICD-10-CM | POA: Diagnosis not present

## 2023-05-15 DIAGNOSIS — N393 Stress incontinence (female) (male): Secondary | ICD-10-CM | POA: Diagnosis not present

## 2023-05-15 DIAGNOSIS — M62838 Other muscle spasm: Secondary | ICD-10-CM | POA: Diagnosis not present

## 2023-05-15 NOTE — Telephone Encounter (Signed)
Angel Chambers with Insurance is needing to speak with a nurse or patient's doctor to confirm when the patient received the cancer diagnosis.  Please advise.  Call:  [phone]

## 2023-05-22 NOTE — Telephone Encounter (Signed)
Hey the number listed is incorrect

## 2023-05-24 ENCOUNTER — Telehealth: Payer: Self-pay

## 2023-05-24 NOTE — Telephone Encounter (Signed)
Incoming call from [place] from patient's [employer] group. Marchelle Folks states patient file for cancer claim and need for Dr. Mena Goes to clarified patient initial diagnosed of cancer. Marchelle Folks states she received two physician statements with two different dates along with pathology report. Marchelle Folks states she need to know when was patient first diagnosed. Marchelle Folks is aware a message will be sent to Dr. Mena Goes on clarification.   Marchelle Folks voiced and awaiting return call with first initial cancer diagnosed date.

## 2023-05-24 NOTE — Telephone Encounter (Signed)
Tried calling insurance back with no answer, will try another time "He was first diagnosed 01/03/2021. We did active surveillance and his second biopsy was on 12/18/2022. Prostatectomy was on 03/29/2023. All three of these dates have path reports. Thanks."

## 2023-05-28 NOTE — Telephone Encounter (Signed)
Tried [employer] with no answer. "He was first diagnosed 01/03/2021. We did active surveillance and his second biopsy was on 12/18/2022. Prostatectomy was on 03/29/2023. All three of these dates have path reports. Thanks."

## 2023-05-30 NOTE — Telephone Encounter (Signed)
Tried [employer] with no answer. "He was first diagnosed 01/03/2021. We did active surveillance and his second biopsy was on 12/18/2022. Prostatectomy was on 03/29/2023. All three of these dates have path reports. Thanks." Left voiced message for return call to my extension.

## 2023-06-01 NOTE — Telephone Encounter (Signed)
Marchelle Folks from patient insurance was made aware of Dr. Mena Goes response and voiced understanding. Marchelle Folks was made aware patient cancer was stage 2 and patient cancer has not spread.

## 2023-06-06 DIAGNOSIS — M62838 Other muscle spasm: Secondary | ICD-10-CM | POA: Diagnosis not present

## 2023-06-06 DIAGNOSIS — N393 Stress incontinence (female) (male): Secondary | ICD-10-CM | POA: Diagnosis not present

## 2023-06-06 DIAGNOSIS — M6281 Muscle weakness (generalized): Secondary | ICD-10-CM | POA: Diagnosis not present

## 2023-06-08 DIAGNOSIS — C73 Malignant neoplasm of thyroid gland: Secondary | ICD-10-CM | POA: Diagnosis not present

## 2023-06-08 DIAGNOSIS — E890 Postprocedural hypothyroidism: Secondary | ICD-10-CM | POA: Diagnosis not present

## 2023-07-06 DIAGNOSIS — Z8546 Personal history of malignant neoplasm of prostate: Secondary | ICD-10-CM | POA: Diagnosis not present

## 2023-07-06 DIAGNOSIS — N393 Stress incontinence (female) (male): Secondary | ICD-10-CM | POA: Diagnosis not present

## 2023-07-18 DIAGNOSIS — K080 Exfoliation of teeth due to systemic causes: Secondary | ICD-10-CM | POA: Diagnosis not present

## 2023-07-20 DIAGNOSIS — M62838 Other muscle spasm: Secondary | ICD-10-CM | POA: Diagnosis not present

## 2023-07-20 DIAGNOSIS — M6281 Muscle weakness (generalized): Secondary | ICD-10-CM | POA: Diagnosis not present

## 2023-07-20 DIAGNOSIS — N393 Stress incontinence (female) (male): Secondary | ICD-10-CM | POA: Diagnosis not present

## 2023-08-06 ENCOUNTER — Ambulatory Visit: Payer: Medicare Other | Admitting: Urology

## 2023-08-09 ENCOUNTER — Encounter: Payer: Self-pay | Admitting: Family Medicine

## 2023-08-09 ENCOUNTER — Ambulatory Visit (INDEPENDENT_AMBULATORY_CARE_PROVIDER_SITE_OTHER): Payer: Medicare Other | Admitting: Family Medicine

## 2023-08-09 VITALS — BP 131/77 | HR 72 | Temp 97.8°F | Ht 73.0 in | Wt 207.0 lb

## 2023-08-09 DIAGNOSIS — C61 Malignant neoplasm of prostate: Secondary | ICD-10-CM

## 2023-08-09 DIAGNOSIS — E038 Other specified hypothyroidism: Secondary | ICD-10-CM

## 2023-08-09 NOTE — Progress Notes (Signed)
Subjective:  Patient ID: Angel Chambers, male    DOB: 06-16-1956  Age: 68 y.o. MRN: [id number]  CC: Medical Management of Chronic Issues (6 month)   HPI Angel Chambers presents for  follow-up on  thyroid. The patient has a history of hypothyroidism for many years. It has been stable recently. Pt. denies any change in  voice, loss of hair, heat or cold intolerance. Energy level has been adequate to good. Patient denies constipation and diarrhea. No myxedema. Medication is as noted below. Verified that pt is taking it daily on an empty stomach. Well tolerated.  Had prostatectomy recently. Having leakage. Urology says it will get better. Weight gain due to the immobility.      02/08/2023    3:52 PM 11/29/2022    3:44 PM 02/07/2022   12:49 PM  Depression screen PHQ 2/9  Decreased Interest 0 0 0  Down, Depressed, Hopeless 0 0 0  PHQ - 2 Score 0 0 0  Altered sleeping 0    Tired, decreased energy 0    Change in appetite 0    Feeling bad or failure about yourself  0    Trouble concentrating 0    Moving slowly or fidgety/restless 0    Suicidal thoughts 0    PHQ-9 Score 0      History Angel Chambers has a past medical history of History of kidney stones, S/P total thyroidectomy (01/14/2018), and Thyroid cancer (HCC).   He has a past surgical history that includes Wrist fracture surgery (Left, 2004); Thyroidectomy (N/A, 01/14/2018); Flexible sigmoidoscopy (N/A, 06/18/2018); Colonoscopy (N/A, 12/24/2018); Robot assisted laparoscopic radical prostatectomy (N/A, 03/29/2023); and Pelvic lymph node dissection (Bilateral, 03/29/2023).   His family history includes Cancer in his sister; Diabetes in his mother; Heart attack in his father; Heart disease in his brother; Hypertension in his brother; Kidney disease in his mother; Thyroid disease in his brother.He reports that he has never smoked. He has never used smokeless tobacco. He reports that he does not drink alcohol and does not use drugs.    ROS Review of  Systems  Constitutional:  Negative for fever.  Respiratory:  Negative for shortness of breath.   Cardiovascular:  Negative for chest pain.  Musculoskeletal:  Negative for arthralgias.  Skin:  Negative for rash.    Objective:  BP 131/77   Pulse 72   Temp 97.8 F (36.6 C) (Temporal)   Ht 6\' 1"  (1.854 m)   Wt 207 lb (93.9 kg)   SpO2 98%   BMI 27.31 kg/m   BP Readings from Last 3 Encounters:  08/09/23 131/77  05/07/23 (!) 158/97  04/19/23 131/85    Wt Readings from Last 3 Encounters:  08/09/23 207 lb (93.9 kg)  04/19/23 192 lb 6.4 oz (87.3 kg)  03/29/23 194 lb (88 kg)     Physical Exam Vitals reviewed.  Constitutional:      Appearance: He is well-developed.  HENT:     Head: Normocephalic and atraumatic.     Right Ear: External ear normal.     Left Ear: External ear normal.     Mouth/Throat:     Pharynx: No oropharyngeal exudate or posterior oropharyngeal erythema.  Eyes:     Pupils: Pupils are equal, round, and reactive to light.  Cardiovascular:     Rate and Rhythm: Normal rate and regular rhythm.     Heart sounds: No murmur heard. Pulmonary:     Effort: No respiratory distress.     Breath sounds: Normal  breath sounds.  Musculoskeletal:     Cervical back: Normal range of motion and neck supple.  Neurological:     Mental Status: He is alert and oriented to person, place, and time.       Assessment & Plan:   Angel Chambers was seen today for medical management of chronic issues.  Diagnoses and all orders for this visit:  Other specified hypothyroidism -     CBC with Differential/Platelet -     CMP14+EGFR -     TSH + free T4  Prostate cancer (HCC) -     CBC with Differential/Platelet       I am having Angel Chambers maintain his docusate sodium, traMADol, and levothyroxine.  Allergies as of 08/09/2023   No Known Allergies      Medication List        Accurate as of August 09, 2023  4:43 PM. If you have any questions, ask your nurse or doctor.           docusate sodium 100 MG capsule Commonly known as: COLACE Take 1 capsule (100 mg total) by mouth 2 (two) times daily.   levothyroxine 137 MCG tablet Commonly known as: SYNTHROID TAKE 1 TABLET BY MOUTH ONCE DAILY BEFORE BREAKFAST   traMADol 50 MG tablet Commonly known as: Ultram Take 1-2 tablets (50-100 mg total) by mouth every 6 (six) hours as needed for moderate pain or severe pain.         Follow-up: No follow-ups on file.  Mechele Claude, M.D.

## 2023-08-10 LAB — CMP14+EGFR
ALT: 16 [IU]/L (ref 0–44)
AST: 16 [IU]/L (ref 0–40)
Albumin: 4.1 g/dL (ref 3.9–4.9)
Alkaline Phosphatase: 95 [IU]/L (ref 44–121)
BUN/Creatinine Ratio: 16 (ref 10–24)
BUN: 15 mg/dL (ref 8–27)
Bilirubin Total: 0.3 mg/dL (ref 0.0–1.2)
CO2: 24 mmol/L (ref 20–29)
Calcium: 9.4 mg/dL (ref 8.6–10.2)
Chloride: 104 mmol/L (ref 96–106)
Creatinine, Ser: 0.95 mg/dL (ref 0.76–1.27)
Globulin, Total: 2.5 g/dL (ref 1.5–4.5)
Glucose: 142 mg/dL — ABNORMAL HIGH (ref 70–99)
Potassium: 4.1 mmol/L (ref 3.5–5.2)
Sodium: 140 mmol/L (ref 134–144)
Total Protein: 6.6 g/dL (ref 6.0–8.5)
eGFR: 88 mL/min/{1.73_m2} (ref >59)

## 2023-08-10 LAB — CBC WITH DIFFERENTIAL/PLATELET
Basophils Absolute: 0 10*3/uL (ref 0.0–0.2)
Basos: 1 %
EOS (ABSOLUTE): 0.1 10*3/uL (ref 0.0–0.4)
Eos: 2 %
Hematocrit: 45.8 % (ref 37.5–51.0)
Hemoglobin: 15.3 g/dL (ref 13.0–17.7)
Immature Grans (Abs): 0 10*3/uL (ref 0.0–0.1)
Immature Granulocytes: 0 %
Lymphocytes Absolute: 1.6 10*3/uL (ref 0.7–3.1)
Lymphs: 33 %
MCH: 32.1 pg (ref 26.6–33.0)
MCHC: 33.4 g/dL (ref 31.5–35.7)
MCV: 96 fL (ref 79–97)
Monocytes Absolute: 0.4 10*3/uL (ref 0.1–0.9)
Monocytes: 8 %
Neutrophils Absolute: 2.8 10*3/uL (ref 1.4–7.0)
Neutrophils: 56 %
Platelets: 202 10*3/uL (ref 150–450)
RBC: 4.77 x10E6/uL (ref 4.14–5.80)
RDW: 12.5 % (ref 11.6–15.4)
WBC: 4.9 10*3/uL (ref 3.4–10.8)

## 2023-08-10 LAB — TSH+FREE T4
Free T4: 1.77 ng/dL (ref 0.82–1.77)
TSH: 0.056 u[IU]/mL — ABNORMAL LOW (ref 0.450–4.500)

## 2023-08-12 ENCOUNTER — Encounter: Payer: Self-pay | Admitting: Family Medicine

## 2023-08-12 NOTE — Progress Notes (Signed)
Hello Angel Chambers,  Your lab result is normal and/or stable.Some minor variations that are not significant are commonly marked abnormal, but do not represent any medical problem for you.  Best regards, Deaisa Merida, M.D.

## 2023-08-16 DIAGNOSIS — M6281 Muscle weakness (generalized): Secondary | ICD-10-CM | POA: Diagnosis not present

## 2023-08-16 DIAGNOSIS — M62838 Other muscle spasm: Secondary | ICD-10-CM | POA: Diagnosis not present

## 2023-08-16 DIAGNOSIS — N393 Stress incontinence (female) (male): Secondary | ICD-10-CM | POA: Diagnosis not present

## 2023-09-19 DIAGNOSIS — M62838 Other muscle spasm: Secondary | ICD-10-CM | POA: Diagnosis not present

## 2023-09-19 DIAGNOSIS — M6281 Muscle weakness (generalized): Secondary | ICD-10-CM | POA: Diagnosis not present

## 2023-09-19 DIAGNOSIS — N393 Stress incontinence (female) (male): Secondary | ICD-10-CM | POA: Diagnosis not present

## 2023-10-29 DIAGNOSIS — K080 Exfoliation of teeth due to systemic causes: Secondary | ICD-10-CM | POA: Diagnosis not present

## 2023-11-15 DIAGNOSIS — M62838 Other muscle spasm: Secondary | ICD-10-CM | POA: Diagnosis not present

## 2023-11-15 DIAGNOSIS — N393 Stress incontinence (female) (male): Secondary | ICD-10-CM | POA: Diagnosis not present

## 2023-11-15 DIAGNOSIS — M6281 Muscle weakness (generalized): Secondary | ICD-10-CM | POA: Diagnosis not present

## 2023-12-11 DIAGNOSIS — E890 Postprocedural hypothyroidism: Secondary | ICD-10-CM | POA: Diagnosis not present

## 2023-12-11 DIAGNOSIS — C73 Malignant neoplasm of thyroid gland: Secondary | ICD-10-CM | POA: Diagnosis not present

## 2024-01-08 DIAGNOSIS — M62838 Other muscle spasm: Secondary | ICD-10-CM | POA: Diagnosis not present

## 2024-01-08 DIAGNOSIS — Z8546 Personal history of malignant neoplasm of prostate: Secondary | ICD-10-CM | POA: Diagnosis not present

## 2024-01-08 DIAGNOSIS — N393 Stress incontinence (female) (male): Secondary | ICD-10-CM | POA: Diagnosis not present

## 2024-01-08 DIAGNOSIS — M6281 Muscle weakness (generalized): Secondary | ICD-10-CM | POA: Diagnosis not present

## 2024-01-25 DIAGNOSIS — Z8546 Personal history of malignant neoplasm of prostate: Secondary | ICD-10-CM | POA: Diagnosis not present

## 2024-01-25 DIAGNOSIS — N393 Stress incontinence (female) (male): Secondary | ICD-10-CM | POA: Diagnosis not present

## 2024-01-25 DIAGNOSIS — N5231 Erectile dysfunction following radical prostatectomy: Secondary | ICD-10-CM | POA: Diagnosis not present

## 2024-02-06 ENCOUNTER — Ambulatory Visit (INDEPENDENT_AMBULATORY_CARE_PROVIDER_SITE_OTHER): Payer: Medicare Other | Admitting: Family Medicine

## 2024-02-06 ENCOUNTER — Encounter: Payer: Self-pay | Admitting: Family Medicine

## 2024-02-06 VITALS — BP 127/86 | HR 71 | Temp 97.7°F | Ht 73.0 in | Wt 197.4 lb

## 2024-02-06 DIAGNOSIS — E038 Other specified hypothyroidism: Secondary | ICD-10-CM

## 2024-02-06 DIAGNOSIS — E782 Mixed hyperlipidemia: Secondary | ICD-10-CM

## 2024-02-06 DIAGNOSIS — C61 Malignant neoplasm of prostate: Secondary | ICD-10-CM

## 2024-02-06 LAB — LIPID PANEL

## 2024-02-07 LAB — CMP14+EGFR
ALT: 16 IU/L (ref 0–44)
AST: 16 IU/L (ref 0–40)
Albumin: 4.5 g/dL (ref 3.9–4.9)
Alkaline Phosphatase: 91 IU/L (ref 44–121)
BUN/Creatinine Ratio: 17 (ref 10–24)
BUN: 15 mg/dL (ref 8–27)
Bilirubin Total: 0.5 mg/dL (ref 0.0–1.2)
CO2: 21 mmol/L (ref 20–29)
Calcium: 9.4 mg/dL (ref 8.6–10.2)
Chloride: 107 mmol/L — ABNORMAL HIGH (ref 96–106)
Creatinine, Ser: 0.87 mg/dL (ref 0.76–1.27)
Globulin, Total: 2.2 g/dL (ref 1.5–4.5)
Glucose: 97 mg/dL (ref 70–99)
Potassium: 3.9 mmol/L (ref 3.5–5.2)
Sodium: 142 mmol/L (ref 134–144)
Total Protein: 6.7 g/dL (ref 6.0–8.5)
eGFR: 95 mL/min/1.73 (ref >59)

## 2024-02-07 LAB — CBC WITH DIFFERENTIAL/PLATELET
Basophils Absolute: 0.1 x10E3/uL (ref 0.0–0.2)
Basos: 1 %
EOS (ABSOLUTE): 0.1 x10E3/uL (ref 0.0–0.4)
Eos: 2 %
Hematocrit: 44.3 % (ref 37.5–51.0)
Hemoglobin: 14.9 g/dL (ref 13.0–17.7)
Immature Grans (Abs): 0 x10E3/uL (ref 0.0–0.1)
Immature Granulocytes: 0 %
Lymphocytes Absolute: 1.7 x10E3/uL (ref 0.7–3.1)
Lymphs: 30 %
MCH: 32.3 pg (ref 26.6–33.0)
MCHC: 33.6 g/dL (ref 31.5–35.7)
MCV: 96 fL (ref 79–97)
Monocytes Absolute: 0.5 x10E3/uL (ref 0.1–0.9)
Monocytes: 9 %
Neutrophils Absolute: 3.4 x10E3/uL (ref 1.4–7.0)
Neutrophils: 58 %
Platelets: 215 x10E3/uL (ref 150–450)
RBC: 4.62 x10E6/uL (ref 4.14–5.80)
RDW: 13.1 % (ref 11.6–15.4)
WBC: 5.8 x10E3/uL (ref 3.4–10.8)

## 2024-02-07 LAB — LIPID PANEL
Cholesterol, Total: 204 mg/dL — AB (ref 100–199)
HDL: 56 mg/dL (ref >39)
LDL CALC COMMENT:: 3.6 ratio (ref 0.0–5.0)
LDL Chol Calc (NIH): 126 mg/dL — AB (ref 0–99)
Triglycerides: 122 mg/dL (ref 0–149)
VLDL Cholesterol Cal: 22 mg/dL (ref 5–40)

## 2024-02-07 LAB — TSH+FREE T4
Free T4: 1.48 ng/dL (ref 0.82–1.77)
TSH: 0.031 u[IU]/mL — AB (ref 0.450–4.500)

## 2024-02-10 ENCOUNTER — Encounter: Payer: Self-pay | Admitting: Family Medicine

## 2024-02-10 NOTE — Progress Notes (Signed)
 Subjective:  Patient ID: Angel Chambers, male    DOB: [birth year]  Age: 68 y.o. MRN: [id number]  CC: 6 MONTH FOLLOW UP   HPI  Discussed the use of AI scribe software for clinical note transcription with the patient, who gave verbal consent to proceed.  History of Present Illness   Angel Chambers is a 68 year old male who presents for a thyroid  checkup.  His last thyroid  blood test on December 11, 2023, showed a low TSH and a slightly high free T4. He has no symptoms such as loss of energy, constipation, diarrhea, hair loss, or heart palpitations. He takes his thyroid  medication daily.  He had his prostate removed in October 2024 and experiences significant urinary leakage, which he attributes to a possible issue with a stitch affecting the bladder valve. He is scheduled to see his doctor in [place] next month. He did not undergo chemotherapy because his doctor told him his prostate was right around his bladder and could not be treated with chemotherapy.  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 He reports good energy levels and no excessive aches or pains. He continues to take a stool softener daily, identified as 'Stusoft'. He denies taking tramadol  for pain and reports no need for pain medication post-prostate surgery.              02/06/2024    3:07 PM 02/08/2023    3:52 PM 11/29/2022    3:44 PM  Depression screen PHQ 2/9  Decreased Interest 0 0 0  Down, Depressed, Hopeless 0 0 0  PHQ - 2 Score 0 0 0  Altered sleeping  0   Tired, decreased energy  0   Change in appetite  0   Feeling bad or failure about yourself   0   Trouble concentrating  0   Moving slowly or fidgety/restless  0   Suicidal thoughts  0   PHQ-9 Score  0     History Angel Chambers has a past medical history of History of kidney stones, S/P total thyroidectomy (01/14/2018), and Thyroid  cancer (HCC).   He has a past surgical history that includes Wrist fracture surgery (Left, 2004); Thyroidectomy (N/A,  01/14/2018); Flexible sigmoidoscopy (N/A, 06/18/2018); Colonoscopy (N/A, 12/24/2018); Robot assisted laparoscopic radical prostatectomy (N/A, 03/29/2023); and Pelvic lymph node dissection (Bilateral, 03/29/2023).   His family history includes Cancer in his sister; Diabetes in his mother; Heart attack in his father; Heart disease in his brother; Hypertension in his brother; Kidney disease in his mother; Thyroid  disease in his brother.He reports that he has never smoked. He has never used smokeless tobacco. He reports that he does not drink alcohol and does not use drugs.    ROS Review of Systems  Constitutional:  Negative for fever.  Respiratory:  Negative for shortness of breath.   Cardiovascular:  Negative for chest pain.  Musculoskeletal:  Negative for arthralgias.  Skin:  Negative for rash.    Objective:  BP 127/86   Pulse 71   Temp 97.7 F (36.5 C)   Ht 6' 1 (1.854 m)   Wt 197 lb 6.4 oz (89.5 kg)   SpO2 97%   BMI 26.04 kg/m   BP Readings from Last 3 Encounters:  02/06/24 127/86  08/09/23 131/77  05/07/23 (!) 158/97    Wt Readings from Last 3 Encounters:  02/06/24 197 lb 6.4 oz (89.5 kg)  08/09/23 207 lb (93.9 kg)  04/19/23 192 lb 6.4 oz (87.3 kg)  Physical Exam Vitals reviewed.  Constitutional:      Appearance: He is well-developed.  HENT:     Head: Normocephalic and atraumatic.     Right Ear: External ear normal.     Left Ear: External ear normal.     Mouth/Throat:     Pharynx: No oropharyngeal exudate or posterior oropharyngeal erythema.  Eyes:     Pupils: Pupils are equal, round, and reactive to light.  Cardiovascular:     Rate and Rhythm: Normal rate and regular rhythm.     Heart sounds: No murmur heard. Pulmonary:     Effort: No respiratory distress.     Breath sounds: Normal breath sounds.  Musculoskeletal:     Cervical back: Normal range of motion and neck supple.  Neurological:     Mental Status: He is alert and oriented to person, place, and  time.      Assessment & Plan:  Other specified hypothyroidism  Mixed hyperlipidemia -     CBC with Differential/Platelet -     CMP14+EGFR -     Lipid panel -     TSH + free T4  Prostate cancer (HCC)    Assessment and Plan    Thyroid  disorder (possible over-replacement) Recent tests suggest over-replacement with low TSH and elevated free T4. No hyperthyroid symptoms reported. - Order repeat thyroid  function tests to reassess TSH and free T4 levels.  Urinary incontinence after prostatectomy Persistent incontinence post-prostatectomy. Urologist suspects a stitch issue contributing to leakage. Further evaluation scheduled. - Continue follow-up with urologist for further evaluation and management.           Follow-up: Return in about 6 months (around 08/08/2024) for [facility] physical.  Butler Der, M.D.

## 2024-02-11 ENCOUNTER — Ambulatory Visit: Payer: Self-pay | Admitting: Family Medicine

## 2024-02-11 NOTE — Progress Notes (Signed)
Hello Wisam,  Your lab result is normal and/or stable.Some minor variations that are not significant are commonly marked abnormal, but do not represent any medical problem for you.  Best regards, Deaisa Merida, M.D.

## 2024-03-05 DIAGNOSIS — K080 Exfoliation of teeth due to systemic causes: Secondary | ICD-10-CM | POA: Diagnosis not present

## 2024-03-17 DIAGNOSIS — R3915 Urgency of urination: Secondary | ICD-10-CM | POA: Diagnosis not present

## 2024-03-17 DIAGNOSIS — N393 Stress incontinence (female) (male): Secondary | ICD-10-CM | POA: Diagnosis not present

## 2024-03-17 DIAGNOSIS — Z8546 Personal history of malignant neoplasm of prostate: Secondary | ICD-10-CM | POA: Diagnosis not present

## 2024-03-18 ENCOUNTER — Other Ambulatory Visit: Payer: Self-pay | Admitting: Family Medicine

## 2024-03-21 ENCOUNTER — Ambulatory Visit: Payer: Self-pay

## 2024-03-21 VITALS — BP 127/86 | HR 71 | Ht 73.0 in | Wt 197.0 lb

## 2024-03-21 DIAGNOSIS — Z0000 Encounter for general adult medical examination without abnormal findings: Secondary | ICD-10-CM

## 2024-03-21 NOTE — Patient Instructions (Signed)
 [name],  Thank you for taking the time for your Medicare Wellness Visit. I appreciate your continued commitment to your health goals. Please review the care plan we discussed, and feel free to reach out if I can assist you further.  Medicare recommends these wellness visits once per year to help you and your care team stay ahead of potential health issues. These visits are designed to focus on prevention, allowing your provider to concentrate on managing your acute and chronic conditions during your regular appointments.  Please note that Annual Wellness Visits do not include a physical exam. Some assessments may be limited, especially if the visit was conducted virtually. If needed, we may recommend a separate in-person follow-up with your provider.  Ongoing Care Seeing your primary care provider every 3 to 6 months helps us  monitor your health and provide consistent, personalized care.   Referrals If a referral was made during today's visit and you haven't received any updates within two weeks, please contact the referred provider directly to check on the status.  Recommended Screenings:  Health Maintenance  Topic Date Due   COVID-19 Vaccine (3 - Moderna risk series) 12/19/2019   Medicare Annual Wellness Visit  11/29/2023   Zoster (Shingles) Vaccine (1 of 2) 05/08/2024*   Pneumococcal Vaccine for age over 74 (1 of 1 - PCV) 08/08/2024*   Flu Shot  09/16/2024*   DTaP/Tdap/Td vaccine (3 - Td or Tdap) 01/10/2026   Colon Cancer Screening  12/23/2028   Hepatitis C Screening  Completed   HPV Vaccine  Aged Out   Meningitis B Vaccine  Aged Out  *Topic was postponed. The date shown is not the original due date.       03/21/2024   10:35 AM  Advanced Directives  Does Patient Have a Medical Advance Directive? No   Advance Care Planning is important because it: Ensures you receive medical care that aligns with your values, goals, and preferences. Provides guidance to your family and  loved ones, reducing the emotional burden of decision-making during critical moments.  Vision: Annual vision screenings are recommended for early detection of glaucoma, cataracts, and diabetic retinopathy. These exams can also reveal signs of chronic conditions such as diabetes and high blood pressure.  Dental: Annual dental screenings help detect early signs of oral cancer, gum disease, and other conditions linked to overall health, including heart disease and diabetes.  Please see the attached documents for additional preventive care recommendations.

## 2024-03-21 NOTE — Progress Notes (Signed)
 Subjective:   Angel Chambers is a 68 y.o. who presents for a Medicare Wellness preventive visit.  As a reminder, Annual Wellness Visits don't include a physical exam, and some assessments may be limited, especially if this visit is performed virtually. We may recommend an in-person follow-up visit with your provider if needed.  Visit Complete: Virtual I connected with  Angel Chambers on 03/21/24 by a audio enabled telemedicine application and verified that I am speaking with the correct person using two identifiers.  Patient Location: Home  Provider Location: Home Office  I discussed the limitations of evaluation and management by telemedicine. The patient expressed understanding and agreed to proceed.  Vital Signs: Because this visit was a virtual/telehealth visit, some criteria may be missing or patient reported. Any vitals not documented were not able to be obtained and vitals that have been documented are patient reported.  VideoDeclined- This patient declined Librarian, academic. Therefore the visit was completed with audio only.  Persons Participating in Visit: Patient.  AWV Questionnaire: No: Patient Medicare AWV questionnaire was not completed prior to this visit.  Cardiac Risk Factors include: advanced age (>46men, >56 women)     Objective:    Today's Vitals   03/21/24 1021  BP: 127/86  Pulse: 71  Weight: 197 lb (89.4 kg)  Height: 6' 1 (1.854 m)   Body mass index is 25.99 kg/m.     03/21/2024   10:35 AM 03/29/2023    3:05 PM 03/22/2023    1:25 PM 11/29/2022    3:45 PM 05/17/2020    4:54 PM 12/24/2018    8:34 AM 06/18/2018    9:24 AM  Advanced Directives  Does Patient Have a Medical Advance Directive? No No No No No No No   Would patient like information on creating a medical advance directive?  No - Patient declined Yes (MAU/Ambulatory/Procedural Areas - Information given) No - Patient declined  No - Patient declined  No - Patient  declined      Data saved with a previous flowsheet row definition    Current Medications (verified) Outpatient Encounter Medications as of 03/21/2024  Medication Sig   docusate sodium  (COLACE) 100 MG capsule Take 1 capsule (100 mg total) by mouth 2 (two) times daily.   levothyroxine  (SYNTHROID ) 137 MCG tablet TAKE 1 TABLET BY MOUTH ONCE DAILY BEFORE BREAKFAST   No facility-administered encounter medications on file as of 03/21/2024.    Allergies (verified) Patient has no known allergies.   History: Past Medical History:  Diagnosis Date   History of kidney stones    S/P total thyroidectomy 01/14/2018   Thyroid  cancer [facility])    Past Surgical History:  Procedure Laterality Date   COLONOSCOPY N/A 12/24/2018   Procedure: COLONOSCOPY;  Surgeon: Shaaron Lamar HERO, MD;  Location: [facility];  Service: Endoscopy;  Laterality: N/A;  12:45pm   FLEXIBLE SIGMOIDOSCOPY N/A 06/18/2018   Procedure: FLEXIBLE SIGMOIDOSCOPY;  Surgeon: Shaaron Lamar HERO, MD;  Location: [facility];  Service: Endoscopy;  Laterality: N/A;   PELVIC LYMPH NODE DISSECTION Bilateral 03/29/2023   Procedure: AND BILATERAL PELVIC LYMPH NODE DISSECTION;  Surgeon: Cam Morene ORN, MD;  Location: [facility];  Service: Urology;  Laterality: Bilateral;   ROBOT ASSISTED LAPAROSCOPIC RADICAL PROSTATECTOMY N/A 03/29/2023   Procedure: XI ROBOTIC ASSISTED LAPAROSCOPIC RADICAL PROSTATECTOMY;  Surgeon: Cam Morene ORN, MD;  Location: [facility];  Service: Urology;  Laterality: N/A;  240 MINUTES   THYROIDECTOMY N/A 01/14/2018   Procedure: TOTAL  THYROIDECTOMY;  Surgeon: Mavis Anes, MD;  Location: [facility];  Service: General;  Laterality: N/A;   WRIST FRACTURE SURGERY Left 2004   Family History  Problem Relation Age of Onset   Diabetes Mother    Kidney disease Mother    Heart attack Father    Cancer Sister        hysterectomy due to cancer   Hypertension Brother    Heart disease Brother    Thyroid  disease Brother    Colon cancer Neg  Hx    Social History   Socioeconomic History   Marital status: Widowed    Spouse name: Not on file   Number of children: Not on file   Years of education: Not on file   Highest education level: Not on file  Occupational History   Not on file  Tobacco Use   Smoking status: Never   Smokeless tobacco: Never  Vaping Use   Vaping status: Never Used  Substance and Sexual Activity   Alcohol use: No   Drug use: No   Sexual activity: Yes  Other Topics Concern   Not on file  Social History Narrative   Not on file   Social Drivers of Health   Financial Resource Strain: Low Risk  (03/21/2024)   Overall Financial Resource Strain (CARDIA)    Difficulty of Paying Living Expenses: Not hard at all  Food Insecurity: No Food Insecurity (03/21/2024)   Hunger Vital Sign    Worried About Running Out of Food in the Last Year: Never true    Ran Out of Food in the Last Year: Never true  Transportation Needs: No Transportation Needs (03/21/2024)   PRAPARE - Administrator, Civil Service (Medical): No    Lack of Transportation (Non-Medical): No  Physical Activity: Insufficiently Active (03/21/2024)   Exercise Vital Sign    Days of Exercise per Week: 3 days    Minutes of Exercise per Session: 30 min  Stress: No Stress Concern Present (03/21/2024)   [employer] of Occupational Health - Occupational Stress Questionnaire    Feeling of Stress: Not at all  Social Connections: Moderately Isolated (03/21/2024)   Social Connection and Isolation Panel    Frequency of Communication with Friends and Family: More than three times a week    Frequency of Social Gatherings with Friends and Family: More than three times a week    Attends Religious Services: More than 4 times per year    Active Member of [employer] or Organizations: No    Attends Banker Meetings: Never    Marital Status: Widowed    Tobacco Counseling Counseling given: Yes    Clinical Intake:  Pre-visit  preparation completed: Yes  Pain : No/denies pain     BMI - recorded: 25.99 Nutritional Status: BMI 25 -29 Overweight Nutritional Risks: None Diabetes: No  No results found for: HGBA1C   How often do you need to have someone help you when you read instructions, pamphlets, or other written materials from your doctor or pharmacy?: 1 - Never  Interpreter Needed?: No  Information entered by :: alia t/cma   Activities of Daily Living     03/21/2024   10:26 AM 03/29/2023    3:05 PM  In your present state of health, do you have any difficulty performing the following activities:  Hearing? 0   Vision? 0   Difficulty concentrating or making decisions? 0   Walking or climbing stairs? 0   Dressing or bathing? 0  Doing errands, shopping? 0 0  Preparing Food and eating ? N   Using the Toilet? N   In the past six months, have you accidently leaked urine? N   Do you have problems with loss of bowel control? N   Managing your Medications? N   Managing your Finances? N   Housekeeping or managing your Housekeeping? N     Patient Care Team: Zollie Lowers, MD as PCP - General (Family Medicine) Shaaron Lamar HERO, MD as Consulting Physician (Gastroenterology)  I have updated your Care Teams any recent Medical Services you may have received from other providers in the past year.     Assessment:   This is a routine wellness examination for Angel Chambers.  Hearing/Vision screen Hearing Screening - Comments:: Pt denies hearing dif   Vision Screening - Comments:: Pt denies vision dif/n/a   Goals Addressed   None    Depression Screen     03/21/2024   10:35 AM 02/06/2024    3:07 PM 02/08/2023    3:52 PM 11/29/2022    3:44 PM 02/07/2022   12:49 PM 10/03/2021    3:17 PM 10/25/2020    2:18 PM  PHQ 2/9 Scores  PHQ - 2 Score 0 0 0 0 0 0 0  PHQ- 9 Score   0        Fall Risk     03/21/2024   10:22 AM 02/06/2024    3:07 PM 02/08/2023    3:52 PM 11/29/2022    3:43 PM 02/07/2022   12:49 PM   Fall Risk   Falls in the past year? 0 0 0 0 0  Number falls in past yr: 0   0   Injury with Fall? 0   0   Risk for fall due to : No Fall Risks   No Fall Risks   Follow up Falls evaluation completed  Falls evaluation completed Falls prevention discussed     MEDICARE RISK AT HOME:  Medicare Risk at Home Any stairs in or around the home?: Yes If so, are there any without handrails?: Yes Home free of loose throw rugs in walkways, pet beds, electrical cords, etc?: Yes Adequate lighting in your home to reduce risk of falls?: Yes Life alert?: No Use of a cane, walker or w/c?: No Grab bars in the bathroom?: Yes Shower chair or bench in shower?: Yes Elevated toilet seat or a handicapped toilet?: Yes  TIMED UP AND GO:  Was the test performed?  no  Cognitive Function: 6CIT completed        11/29/2022    3:45 PM  6CIT Screen  What Year? 0 points  What month? 0 points  What time? 0 points  Count back from 20 0 points  Months in reverse 0 points  Repeat phrase 0 points  Total Score 0 points    Immunizations Immunization History  Administered Date(s) Administered   Moderna Sars-Covid-2 Vaccination 10/17/2019, 11/21/2019   Td (Adult),5 Lf Tetanus Toxid, Preservative Free 12/19/1995   Tdap 06/19/2012, 01/11/2016    Screening Tests Health Maintenance  Topic Date Due   COVID-19 Vaccine (3 - Moderna risk series) 12/19/2019   Zoster Vaccines- Shingrix (1 of 2) 05/08/2024 (Originally [birth year]/15/1975)   Pneumococcal Vaccine: 50+ Years (1 of 1 - PCV) 08/08/2024 (Originally 11/15/2006)   Influenza Vaccine  09/16/2024 (Originally 01/18/2024)   Medicare Annual Wellness (AWV)  03/21/2025   DTaP/Tdap/Td (3 - Td or Tdap) 01/10/2026   Colonoscopy  12/23/2028   Hepatitis C Screening  Completed   HPV VACCINES  Aged Out   Meningococcal B Vaccine  Aged Out    Health Maintenance Items Addressed: See Nurse Notes at the end of this note  Additional Screening:  Vision Screening: Recommended  annual ophthalmology exams for early detection of glaucoma and other disorders of the eye. Is the patient up to date with their annual eye exam?  Yes  Who is the provider or what is the name of the office in which the patient attends annual eye exams? N/a  Dental Screening: Recommended annual dental exams for proper oral hygiene  Community Resource Referral / Chronic Care Management: CRR required this visit?  No   CCM required this visit?  No   Plan:    I have personally reviewed and noted the following in the patient's chart:   Medical and social history Use of alcohol, tobacco or illicit drugs  Current medications and supplements including opioid prescriptions. Patient is not currently taking opioid prescriptions. Functional ability and status Nutritional status Physical activity Advanced directives List of other physicians Hospitalizations, surgeries, and ER visits in previous 12 months Vitals Screenings to include cognitive, depression, and falls Referrals and appointments  In addition, I have reviewed and discussed with patient certain preventive protocols, quality metrics, and best practice recommendations. A written personalized care plan for preventive services as well as general preventive health recommendations were provided to patient.   Angel Chambers, CMA   03/21/2024   After Visit Summary: (MyChart) Due to this being a telephonic visit, the after visit summary with patients personalized plan was offered to patient via MyChart   Notes: Nothing significant to report at this time.

## 2024-04-17 DIAGNOSIS — J00 Acute nasopharyngitis [common cold]: Secondary | ICD-10-CM | POA: Diagnosis not present

## 2024-04-17 DIAGNOSIS — R030 Elevated blood-pressure reading, without diagnosis of hypertension: Secondary | ICD-10-CM | POA: Diagnosis not present

## 2024-04-17 DIAGNOSIS — R070 Pain in throat: Secondary | ICD-10-CM | POA: Diagnosis not present

## 2024-05-21 DIAGNOSIS — Z8546 Personal history of malignant neoplasm of prostate: Secondary | ICD-10-CM | POA: Diagnosis not present

## 2024-06-04 DIAGNOSIS — E890 Postprocedural hypothyroidism: Secondary | ICD-10-CM | POA: Diagnosis not present

## 2024-06-04 DIAGNOSIS — C73 Malignant neoplasm of thyroid gland: Secondary | ICD-10-CM | POA: Diagnosis not present

## 2024-08-12 ENCOUNTER — Encounter: Payer: Self-pay | Admitting: Family Medicine
